# Patient Record
Sex: Female | Born: 1990 | Race: White | Hispanic: No | Marital: Married | Smoking: Never smoker
Health system: Southern US, Community
[De-identification: ages and names within clinical notes are randomized; demographics above are authoritative.]

---

## 2016-05-17 ENCOUNTER — Ambulatory Visit (HOSPITAL_COMMUNITY): Payer: BLUE CROSS/BLUE SHIELD | Admitting: Psychiatry

## 2017-04-23 DIAGNOSIS — F419 Anxiety disorder, unspecified: Secondary | ICD-10-CM | POA: Insufficient documentation

## 2017-04-25 DIAGNOSIS — R894 Abnormal immunological findings in specimens from other organs, systems and tissues: Secondary | ICD-10-CM | POA: Insufficient documentation
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
## 2018-04-29 ENCOUNTER — Ambulatory Visit (HOSPITAL_COMMUNITY): Payer: 59 | Admitting: Psychiatry

## 2018-04-29 ENCOUNTER — Encounter (HOSPITAL_COMMUNITY): Payer: Self-pay | Admitting: Psychiatry

## 2018-04-29 ENCOUNTER — Encounter (INDEPENDENT_AMBULATORY_CARE_PROVIDER_SITE_OTHER): Payer: Self-pay

## 2018-04-29 VITALS — BP 109/72 | HR 69 | Ht 66.0 in | Wt 192.0 lb

## 2018-04-29 DIAGNOSIS — F4310 Post-traumatic stress disorder, unspecified: Secondary | ICD-10-CM | POA: Insufficient documentation

## 2018-04-29 DIAGNOSIS — F331 Major depressive disorder, recurrent, moderate: Secondary | ICD-10-CM

## 2018-04-29 LAB — TSH: TSH: 1.34 mIU/L

## 2018-04-29 MED ORDER — VENLAFAXINE HCL ER 37.5 MG PO CP24: 37.5000 mg | ORAL_CAPSULE | Freq: Every day | ORAL | 0 refills | Status: DC

## 2018-04-29 NOTE — Patient Instructions (Signed)
1. Start venlafaxine 37.5 mg daily for one week, then 75 mg daily  2. Continue Abilify 20 mg daily  3. Return to clinic in one month for 30 mins 4. Check TSH
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
## 2018-05-30 ENCOUNTER — Other Ambulatory Visit: Payer: Self-pay

## 2018-05-30 ENCOUNTER — Ambulatory Visit (INDEPENDENT_AMBULATORY_CARE_PROVIDER_SITE_OTHER): Payer: 59 | Admitting: Psychiatry

## 2018-05-30 ENCOUNTER — Encounter (HOSPITAL_COMMUNITY): Payer: Self-pay | Admitting: Psychiatry

## 2018-05-30 DIAGNOSIS — F4310 Post-traumatic stress disorder, unspecified: Secondary | ICD-10-CM

## 2018-05-30 DIAGNOSIS — Z79899 Other long term (current) drug therapy: Secondary | ICD-10-CM | POA: Diagnosis not present

## 2018-05-30 DIAGNOSIS — F330 Major depressive disorder, recurrent, mild: Secondary | ICD-10-CM

## 2018-05-30 MED ORDER — VENLAFAXINE HCL ER 75 MG PO CP24: 75.0000 mg | ORAL_CAPSULE | Freq: Every day | ORAL | 0 refills | Status: DC

## 2018-05-30 NOTE — Patient Instructions (Signed)
1. Continue venlafaxine 75 mg daily 2. Hold Abilify 3. Return to clinic in one month for 30 mins, 5/1 at 9 AM for 15 mins
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
## 2018-06-27 ENCOUNTER — Other Ambulatory Visit: Payer: Self-pay

## 2018-06-27 ENCOUNTER — Encounter (HOSPITAL_COMMUNITY): Payer: 59 | Admitting: Psychiatry

## 2018-06-30 NOTE — Progress Notes (Signed)
This encounter was created in error - please disregard.
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
## 2018-07-28 ENCOUNTER — Other Ambulatory Visit: Payer: Self-pay

## 2018-07-28 ENCOUNTER — Ambulatory Visit (INDEPENDENT_AMBULATORY_CARE_PROVIDER_SITE_OTHER): Payer: 59 | Admitting: Psychiatry

## 2018-07-28 ENCOUNTER — Encounter (HOSPITAL_COMMUNITY): Payer: Self-pay | Admitting: Psychiatry

## 2018-07-28 DIAGNOSIS — F4310 Post-traumatic stress disorder, unspecified: Secondary | ICD-10-CM

## 2018-07-28 DIAGNOSIS — F3341 Major depressive disorder, recurrent, in partial remission: Secondary | ICD-10-CM

## 2018-07-28 MED ORDER — VENLAFAXINE HCL ER 37.5 MG PO CP24: ORAL_CAPSULE | ORAL | 0 refills | Status: DC

## 2018-07-28 MED ORDER — VENLAFAXINE HCL ER 75 MG PO CP24: ORAL_CAPSULE | ORAL | 0 refills | Status: DC

## 2018-08-13 ENCOUNTER — Telehealth (HOSPITAL_COMMUNITY): Payer: Self-pay | Admitting: Psychiatry

## 2018-08-13 NOTE — Telephone Encounter (Signed)
Patient called and said that since her medication Effexor was increased she has had increased suicidal thoughts and she has been unable to focus.  Patient said that this has been going on for almost one week. [phone] phone.    Please advise

## 2018-08-13 NOTE — Telephone Encounter (Signed)
Dr. Hisada's pt 
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
## 2018-08-13 NOTE — Telephone Encounter (Signed)
Called the patient. She states that she has been feeling more depressed and has had SI for the past week. She denies plan, intent. Although she goes to work regularly, she has had worsening in inattention. Her therapist is on leave and has not  been able to see her for the past two weeks. She has not noticed any other change except the medication dose change since the last appointment. Discussed the following: - Decrease venlafaxine 75 mg daily - Emergency resources which includes 911, ED, suicide crisis line [phone]) are discussed.   - Next appointment: 6/24 at 8:40  - She agrees to contact our office if any additional concerns.

## 2018-08-20 ENCOUNTER — Encounter (HOSPITAL_COMMUNITY): Payer: Self-pay | Admitting: Psychiatry

## 2018-08-20 ENCOUNTER — Ambulatory Visit (INDEPENDENT_AMBULATORY_CARE_PROVIDER_SITE_OTHER): Payer: 59 | Admitting: Psychiatry

## 2018-08-20 ENCOUNTER — Other Ambulatory Visit: Payer: Self-pay

## 2018-08-20 DIAGNOSIS — F4310 Post-traumatic stress disorder, unspecified: Secondary | ICD-10-CM

## 2018-08-20 DIAGNOSIS — F331 Major depressive disorder, recurrent, moderate: Secondary | ICD-10-CM

## 2018-08-20 MED ORDER — VENLAFAXINE HCL ER 75 MG PO CP24: 75.0000 mg | ORAL_CAPSULE | Freq: Every day | ORAL | 0 refills | Status: DC

## 2018-08-20 NOTE — Patient Instructions (Signed)
1.Continue venlafaxine 75 mg daily 2. Next appointment;  7/23 at 4 PM
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
## 2018-09-18 ENCOUNTER — Other Ambulatory Visit: Payer: Self-pay

## 2018-09-18 ENCOUNTER — Ambulatory Visit (INDEPENDENT_AMBULATORY_CARE_PROVIDER_SITE_OTHER): Payer: 59 | Admitting: Psychiatry

## 2018-09-18 ENCOUNTER — Encounter (HOSPITAL_COMMUNITY): Payer: Self-pay | Admitting: Psychiatry

## 2018-09-18 DIAGNOSIS — F4310 Post-traumatic stress disorder, unspecified: Secondary | ICD-10-CM

## 2018-09-18 DIAGNOSIS — F331 Major depressive disorder, recurrent, moderate: Secondary | ICD-10-CM | POA: Diagnosis not present

## 2018-09-18 MED ORDER — ESCITALOPRAM OXALATE 10 MG PO TABS: ORAL_TABLET | ORAL | 0 refills | Status: DC

## 2018-09-18 NOTE — Patient Instructions (Signed)
1. Start lexapro 5 mg daily for one week, then 10 mg daily  2. Decrease venlafaxine 37.5 mg daily for one week, then discontinue  3. Next appointment: 8/19 at 9:20

## 2018-09-29 ENCOUNTER — Ambulatory Visit (HOSPITAL_COMMUNITY): Payer: 59 | Admitting: Psychiatry
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
## 2018-10-15 ENCOUNTER — Other Ambulatory Visit: Payer: Self-pay

## 2018-10-15 ENCOUNTER — Ambulatory Visit (HOSPITAL_COMMUNITY): Payer: 59 | Admitting: Psychiatry

## 2018-11-19 ENCOUNTER — Other Ambulatory Visit (HOSPITAL_COMMUNITY): Payer: Self-pay | Admitting: Psychiatry

## 2018-11-19 ENCOUNTER — Telehealth (HOSPITAL_COMMUNITY): Payer: Self-pay | Admitting: *Deleted

## 2018-11-19 MED ORDER — ESCITALOPRAM OXALATE 10 MG PO TABS: 10.0000 mg | ORAL_TABLET | Freq: Every day | ORAL | 0 refills | Status: DC

## 2018-11-19 NOTE — Telephone Encounter (Signed)
Ordered lexapro. Please discuss attendance policy.

## 2018-11-19 NOTE — Telephone Encounter (Signed)
Patient called [facility] to Reschedule appt that she no showed on 10/15/2018. Next Appt 12/05/2018 &  She need's refills.

## 2018-11-26 NOTE — Progress Notes (Signed)
This encounter was created in error - please disregard.

## 2018-12-05 ENCOUNTER — Other Ambulatory Visit: Payer: Self-pay

## 2018-12-05 ENCOUNTER — Encounter (HOSPITAL_COMMUNITY): Payer: 59 | Admitting: Psychiatry

## 2018-12-05 ENCOUNTER — Telehealth (HOSPITAL_COMMUNITY): Payer: Self-pay | Admitting: Psychiatry

## 2018-12-05 NOTE — Telephone Encounter (Signed)
She states that she misunderstood the scheduled time. She is driving, and on her way for family emergency. She is unable to pull over her car. Will reschedule the appointment. 10/19 at 10:20.
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
## 2018-12-15 ENCOUNTER — Other Ambulatory Visit: Payer: Self-pay

## 2018-12-15 ENCOUNTER — Encounter (HOSPITAL_COMMUNITY): Payer: Self-pay | Admitting: Psychiatry

## 2018-12-15 ENCOUNTER — Ambulatory Visit (INDEPENDENT_AMBULATORY_CARE_PROVIDER_SITE_OTHER): Payer: 59 | Admitting: Psychiatry

## 2018-12-15 DIAGNOSIS — F331 Major depressive disorder, recurrent, moderate: Secondary | ICD-10-CM

## 2018-12-15 DIAGNOSIS — F4310 Post-traumatic stress disorder, unspecified: Secondary | ICD-10-CM

## 2018-12-15 MED ORDER — ESCITALOPRAM OXALATE 20 MG PO TABS: 20.0000 mg | ORAL_TABLET | Freq: Every day | ORAL | 0 refills | Status: DC

## 2019-03-17 ENCOUNTER — Ambulatory Visit (HOSPITAL_COMMUNITY): Payer: 59 | Admitting: Psychiatry

## 2019-04-02 ENCOUNTER — Encounter: Payer: Self-pay | Admitting: Psychiatry

## 2019-04-02 ENCOUNTER — Other Ambulatory Visit: Payer: Self-pay

## 2019-04-02 ENCOUNTER — Ambulatory Visit (INDEPENDENT_AMBULATORY_CARE_PROVIDER_SITE_OTHER): Payer: 59 | Admitting: Psychiatry

## 2019-04-02 DIAGNOSIS — F4310 Post-traumatic stress disorder, unspecified: Secondary | ICD-10-CM

## 2019-04-02 DIAGNOSIS — F3341 Major depressive disorder, recurrent, in partial remission: Secondary | ICD-10-CM

## 2019-04-02 MED ORDER — DOXEPIN HCL 25 MG PO CAPS: ORAL_CAPSULE | ORAL | 2 refills | Status: DC

## 2019-04-02 MED ORDER — ESCITALOPRAM OXALATE 20 MG PO TABS: 20.0000 mg | ORAL_TABLET | Freq: Every day | ORAL | 0 refills | Status: DC

## 2019-04-02 NOTE — Progress Notes (Signed)
[facility] MD OP Progress Note I connected with  Brooke Allison on 04/02/19 by a video enabled telemedicine application and verified that I am speaking with the correct person using two identifiers.   I discussed the limitations of evaluation and management by telemedicine. The patient expressed understanding and agreed to proceed.    04/02/2019 11:08 AM Brooke Allison  MRN:  [id number]  Chief Complaint: " I am doing all right, but still have a hard time with sleep."  HPI: Patient reported that her mood has been stable with increased dose of Lexapro.  However she is having difficulty in falling asleep as well as staying asleep.  She does have a 1-year-old who does disrupt her sleep at times.  However she has a hard time going to sleep and as result she does not feel well rested on the next day.  She has 3 children and she works from home.  She stated that without adequate sleep is difficult for her to function during the daytime.  She reported that she used to have more frequent nightmares but that has improved significantly over the past few months.  She does not think the nightmares are disrupting her sleep. She has tried trazodone in the past but found to be too strong and could not tolerate it well.  She was open to trying doxepin to help her with difficulty in falling asleep as well as staying asleep.  Visit Diagnosis:    ICD-10-CM   1. PTSD (post-traumatic stress disorder)  F43.10   2. MDD (major depressive disorder), recurrent, in partial remission ([place])  F33.41     Past Psychiatric History: MDD, PTSD  Past Medical History: No past medical history on file. No past surgical history on file.  Family Psychiatric History: see below  Family History:  Family History  Problem Relation Age of Onset  . Bipolar disorder Mother   . Depression Mother   . Alcohol abuse Father     Social History:  Social History   Socioeconomic History  . Marital status: Married    Spouse name: Not on file  .  Number of children: Not on file  . Years of education: Not on file  . Highest education level: Not on file  Occupational History  . Not on file  Tobacco Use  . Smoking status: Not on file  Substance and Sexual Activity  . Alcohol use: Not on file  . Drug use: Not on file  . Sexual activity: Not on file  Other Topics Concern  . Not on file  Social History Narrative  . Not on file   Social Determinants of Health   Financial Resource Strain:   . Difficulty of Paying Living Expenses: Not on file  Food Insecurity:   . Worried About Charity fundraiser in the Last Year: Not on file  . Ran Out of Food in the Last Year: Not on file  Transportation Needs:   . Lack of Transportation (Medical): Not on file  . Lack of Transportation (Non-Medical): Not on file  Physical Activity:   . Days of Exercise per Week: Not on file  . Minutes of Exercise per Session: Not on file  Stress:   . Feeling of Stress : Not on file  Social Connections:   . Frequency of Communication with Friends and Family: Not on file  . Frequency of Social Gatherings with Friends and Family: Not on file  . Attends Religious Services: Not on file  . Active Member of Clubs  or Organizations: Not on file  . Attends Banker Meetings: Not on file  . Marital Status: Not on file    Allergies:  Allergies  Allergen Reactions  . Sulfa Antibiotics     Shortness of breath    Metabolic Disorder Labs: No results found for: HGBA1C, MPG No results found for: PROLACTIN No results found for: CHOL, TRIG, HDL, CHOLHDL, VLDL, LDLCALC Lab Results  Component Value Date   TSH 1.34 04/29/2018    Therapeutic Level Labs: No results found for: LITHIUM No results found for: VALPROATE No components found for:  CBMZ  Current Medications: Current Outpatient Medications  Medication Sig Dispense Refill  . escitalopram (LEXAPRO) 20 MG tablet Take 1 tablet (20 mg total) by mouth daily. 90 tablet 0   No current  facility-administered medications for this visit.    Musculoskeletal: Strength & Muscle Tone: unable to assess due to telemed visit Gait & Station: unable to assess due to telemed visit Patient leans: unable to assess due to telemed visit    Psychiatric Specialty Exam: Review of Systems  There were no vitals taken for this visit.There is no height or weight on file to calculate BMI.  General Appearance: Fairly Groomed  Eye Contact:  Good  Speech:  Clear and Coherent and Normal Rate  Volume:  Normal  Mood:  Euthymic  Affect:  Congruent  Thought Process:  Goal Directed, Linear and Descriptions of Associations: Intact  Orientation:  Full (Time, Place, and Person)  Thought Content: Logical   Suicidal Thoughts:  No  Homicidal Thoughts:  No  Memory:  Recent;   Good Remote;   Good  Judgement:  Good  Insight:  Good  Psychomotor Activity:  Normal  Concentration:  Concentration: Good and Attention Span: Good  Recall:  Good  Fund of Knowledge: Good  Language: Good  Akathisia:  Negative  Handed:  Right  AIMS (if indicated): not done  Assets:  Communication Skills Desire for Improvement Financial Resources/Insurance Housing  ADL's:  Intact  Cognition: WNL  Sleep:  Poor      Assessment and Plan: 29 year old female with history of PTSD and MDD now reporting remission of depressive symptoms however has ongoing difficulties with sleep.  She has stopped seeing a therapist for EMDR due to financial and other reasons.  She was offered doxepin to help her with sleep. Potential side effects of medication and risks vs benefits of treatment vs non-treatment were explained and discussed. All questions were answered.  1. PTSD (post-traumatic stress disorder)  - Continue escitalopram (LEXAPRO) 20 MG tablet; Take 1 tablet (20 mg total) by mouth daily.  Dispense: 90 tablet; Refill: 0  2. MDD (major depressive disorder), recurrent, in partial remission ([facility])  - escitalopram (LEXAPRO) 20 MG  tablet; Take 1 tablet (20 mg total) by mouth daily.  Dispense: 90 tablet; Refill: 0 - Start doxepin (SINEQUAN) 25 MG capsule; Take one to two capsules at night as needed for sleep  Dispense: 60 capsule; Refill: 2   Follow up in 3 months.   Zena Amos, MD 04/02/2019, 11:08 AM
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
## 2019-06-30 ENCOUNTER — Telehealth (INDEPENDENT_AMBULATORY_CARE_PROVIDER_SITE_OTHER): Payer: 59 | Admitting: Psychiatry

## 2019-06-30 ENCOUNTER — Other Ambulatory Visit: Payer: Self-pay

## 2019-06-30 ENCOUNTER — Encounter (HOSPITAL_COMMUNITY): Payer: Self-pay | Admitting: Psychiatry

## 2019-06-30 DIAGNOSIS — R41840 Attention and concentration deficit: Secondary | ICD-10-CM | POA: Diagnosis not present

## 2019-06-30 DIAGNOSIS — F4310 Post-traumatic stress disorder, unspecified: Secondary | ICD-10-CM | POA: Diagnosis not present

## 2019-06-30 DIAGNOSIS — F3342 Major depressive disorder, recurrent, in full remission: Secondary | ICD-10-CM

## 2019-06-30 MED ORDER — ESCITALOPRAM OXALATE 20 MG PO TABS: 20.0000 mg | ORAL_TABLET | Freq: Every day | ORAL | 0 refills | Status: DC

## 2019-06-30 NOTE — Patient Instructions (Signed)
1. Continue lexapro 20 mg daily  2. Referral to neuropsychological testing 3. Next appointment: 6/28 at 10:20
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
## 2019-08-24 ENCOUNTER — Encounter (HOSPITAL_COMMUNITY): Payer: Self-pay | Admitting: Psychiatry

## 2019-08-24 ENCOUNTER — Telehealth (INDEPENDENT_AMBULATORY_CARE_PROVIDER_SITE_OTHER): Payer: 59 | Admitting: Psychiatry

## 2019-08-24 DIAGNOSIS — R41840 Attention and concentration deficit: Secondary | ICD-10-CM

## 2019-08-24 DIAGNOSIS — G4700 Insomnia, unspecified: Secondary | ICD-10-CM | POA: Diagnosis not present

## 2019-08-24 DIAGNOSIS — F4310 Post-traumatic stress disorder, unspecified: Secondary | ICD-10-CM | POA: Diagnosis not present

## 2019-08-24 DIAGNOSIS — F330 Major depressive disorder, recurrent, mild: Secondary | ICD-10-CM

## 2019-08-24 MED ORDER — ESCITALOPRAM OXALATE 20 MG PO TABS: 20.0000 mg | ORAL_TABLET | Freq: Every day | ORAL | 0 refills | Status: DC

## 2019-08-24 MED ORDER — BUPROPION HCL ER (XL) 150 MG PO TB24: 150.0000 mg | ORAL_TABLET | Freq: Every day | ORAL | 1 refills | Status: DC

## 2019-08-24 NOTE — Patient Instructions (Signed)
1.Continue lexapro 20 mg daily 2. Start bupropion 150 mg daily  3. Continue doxepin 25-50 mg at night as needed for sleep 4.Next appointment: 8/16 at 10:50
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
## 2019-10-12 ENCOUNTER — Encounter (HOSPITAL_COMMUNITY): Payer: Self-pay | Admitting: Psychiatry

## 2019-10-12 ENCOUNTER — Telehealth (INDEPENDENT_AMBULATORY_CARE_PROVIDER_SITE_OTHER): Payer: 59 | Admitting: Psychiatry

## 2019-10-12 ENCOUNTER — Other Ambulatory Visit: Payer: Self-pay

## 2019-10-12 DIAGNOSIS — F330 Major depressive disorder, recurrent, mild: Secondary | ICD-10-CM | POA: Diagnosis not present

## 2019-10-12 DIAGNOSIS — F4310 Post-traumatic stress disorder, unspecified: Secondary | ICD-10-CM

## 2019-10-12 DIAGNOSIS — G4700 Insomnia, unspecified: Secondary | ICD-10-CM

## 2019-10-12 DIAGNOSIS — R41840 Attention and concentration deficit: Secondary | ICD-10-CM

## 2019-10-12 MED ORDER — BUPROPION HCL ER (XL) 300 MG PO TB24: 300.0000 mg | ORAL_TABLET | Freq: Every day | ORAL | 1 refills | Status: DC

## 2019-10-12 NOTE — Patient Instructions (Signed)
1.Continuelexapro 20 mg daily 2. Increasebupropion 300 mg daily  3. Continue doxepin 25-50 mg at night as needed for sleep 4.Next appointment:10/4 at 10:40

## 2019-10-22 ENCOUNTER — Ambulatory Visit (INDEPENDENT_AMBULATORY_CARE_PROVIDER_SITE_OTHER): Payer: 59 | Admitting: Psychology

## 2019-10-22 DIAGNOSIS — F4310 Post-traumatic stress disorder, unspecified: Secondary | ICD-10-CM

## 2019-10-22 DIAGNOSIS — F411 Generalized anxiety disorder: Secondary | ICD-10-CM

## 2019-11-27 ENCOUNTER — Ambulatory Visit: Payer: 59 | Admitting: Psychology
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
## 2019-11-30 ENCOUNTER — Telehealth (INDEPENDENT_AMBULATORY_CARE_PROVIDER_SITE_OTHER): Payer: 59 | Admitting: Psychiatry

## 2019-11-30 ENCOUNTER — Other Ambulatory Visit: Payer: Self-pay

## 2019-11-30 ENCOUNTER — Encounter (HOSPITAL_COMMUNITY): Payer: Self-pay | Admitting: Psychiatry

## 2019-11-30 DIAGNOSIS — F4310 Post-traumatic stress disorder, unspecified: Secondary | ICD-10-CM

## 2019-11-30 DIAGNOSIS — F331 Major depressive disorder, recurrent, moderate: Secondary | ICD-10-CM | POA: Diagnosis not present

## 2019-11-30 MED ORDER — BUPROPION HCL ER (XL) 150 MG PO TB24: ORAL_TABLET | ORAL | 1 refills | Status: DC

## 2019-11-30 MED ORDER — BUPROPION HCL ER (XL) 300 MG PO TB24: ORAL_TABLET | ORAL | 1 refills | Status: DC

## 2019-11-30 MED ORDER — ESCITALOPRAM OXALATE 20 MG PO TABS: 20.0000 mg | ORAL_TABLET | Freq: Every day | ORAL | 0 refills | Status: DC

## 2019-11-30 NOTE — Patient Instructions (Signed)
1.Continuelexapro 20 mg daily 2. Increase bupropion 300 mg daily  3.Continue doxepin 25-50 mg at night as needed for sleep 4.Next appointment: 11.8 at 2:30

## 2019-12-03 ENCOUNTER — Telehealth (HOSPITAL_COMMUNITY): Payer: Self-pay | Admitting: Psychiatry

## 2019-12-03 NOTE — Telephone Encounter (Signed)
Called patient to advised letter was available for pick up, patient advised no longer needs was not approved for time off and has been working all week. Letter was discarded.

## 2019-12-07 ENCOUNTER — Telehealth (HOSPITAL_COMMUNITY): Payer: Self-pay | Admitting: Psychiatry

## 2019-12-07 NOTE — Telephone Encounter (Signed)
D:  Dr. Vanetta Shawl referred pt to [facility].  A:  Placed call to pt, left vm for patient to call case manager back.  Inform Dr. Vanetta Shawl.
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
## 2020-01-04 ENCOUNTER — Encounter (HOSPITAL_COMMUNITY): Payer: Self-pay

## 2020-01-04 ENCOUNTER — Emergency Department (HOSPITAL_COMMUNITY): Payer: Managed Care, Other (non HMO)

## 2020-01-04 ENCOUNTER — Emergency Department (HOSPITAL_COMMUNITY)
Admission: EM | Admit: 2020-01-04 | Discharge: 2020-01-04 | Disposition: A | Discharge status: Home / Self Care | Payer: Managed Care, Other (non HMO) | Attending: Emergency Medicine | Admitting: Emergency Medicine

## 2020-01-04 ENCOUNTER — Telehealth (INDEPENDENT_AMBULATORY_CARE_PROVIDER_SITE_OTHER): Payer: 59 | Admitting: Psychiatry

## 2020-01-04 ENCOUNTER — Telehealth (HOSPITAL_COMMUNITY): Payer: 59 | Admitting: Psychiatry

## 2020-01-04 ENCOUNTER — Other Ambulatory Visit: Payer: Self-pay

## 2020-01-04 DIAGNOSIS — K209 Esophagitis, unspecified without bleeding: Secondary | ICD-10-CM | POA: Diagnosis not present

## 2020-01-04 DIAGNOSIS — F4310 Post-traumatic stress disorder, unspecified: Secondary | ICD-10-CM

## 2020-01-04 DIAGNOSIS — F331 Major depressive disorder, recurrent, moderate: Secondary | ICD-10-CM

## 2020-01-04 DIAGNOSIS — R1013 Epigastric pain: Secondary | ICD-10-CM | POA: Diagnosis present

## 2020-01-04 LAB — CBC WITH DIFFERENTIAL/PLATELET
Abs Immature Granulocytes: 0.01 10*3/uL (ref 0.00–0.07)
Basophils Absolute: 0 10*3/uL (ref 0.0–0.1)
Basophils Relative: 0 %
Eosinophils Absolute: 0.1 10*3/uL (ref 0.0–0.5)
Eosinophils Relative: 1 %
HCT: 39.8 % (ref 36.0–46.0)
Hemoglobin: 12.4 g/dL (ref 12.0–15.0)
Immature Granulocytes: 0 %
Lymphocytes Relative: 21 %
Lymphs Abs: 1.4 10*3/uL (ref 0.7–4.0)
MCH: 25.4 pg — ABNORMAL LOW (ref 26.0–34.0)
MCHC: 31.2 g/dL (ref 30.0–36.0)
MCV: 81.4 fL (ref 80.0–100.0)
Monocytes Absolute: 0.4 10*3/uL (ref 0.1–1.0)
Monocytes Relative: 6 %
Neutro Abs: 5 10*3/uL (ref 1.7–7.7)
Neutrophils Relative %: 72 %
Platelets: 277 10*3/uL (ref 150–400)
RBC: 4.89 MIL/uL (ref 3.87–5.11)
RDW: 14.2 % (ref 11.5–15.5)
WBC: 6.9 10*3/uL (ref 4.0–10.5)
nRBC: 0 % (ref 0.0–0.2)

## 2020-01-04 LAB — COMPREHENSIVE METABOLIC PANEL
ALT: 16 U/L (ref 0–44)
AST: 19 U/L (ref 15–41)
Albumin: 4 g/dL (ref 3.5–5.0)
Alkaline Phosphatase: 71 U/L (ref 38–126)
Anion gap: 11 (ref 5–15)
BUN: 13 mg/dL (ref 6–20)
CO2: 25 mmol/L (ref 22–32)
Calcium: 9 mg/dL (ref 8.9–10.3)
Chloride: 101 mmol/L (ref 98–111)
Creatinine, Ser: 0.8 mg/dL (ref 0.44–1.00)
GFR, Estimated: >60 mL/min (ref >60)
Glucose, Bld: 93 mg/dL (ref 70–99)
Potassium: 3.5 mmol/L (ref 3.5–5.1)
Sodium: 137 mmol/L (ref 135–145)
Total Bilirubin: 0.7 mg/dL (ref 0.3–1.2)
Total Protein: 7.3 g/dL (ref 6.5–8.1)

## 2020-01-04 MED ORDER — PANTOPRAZOLE SODIUM 40 MG PO TBEC
40.0000 mg | DELAYED_RELEASE_TABLET | Freq: Once | ORAL | Status: AC
  Administered 2020-01-04: 40 mg via ORAL
  Filled 2020-01-04: qty 1

## 2020-01-04 MED ORDER — PANTOPRAZOLE SODIUM 40 MG PO TBEC: 40.0000 mg | DELAYED_RELEASE_TABLET | Freq: Every day | ORAL | 1 refills | Status: DC

## 2020-01-04 MED ORDER — PRAZOSIN HCL 1 MG PO CAPS: 1.0000 mg | ORAL_CAPSULE | Freq: Every day | ORAL | 0 refills | Status: DC

## 2020-01-04 MED ORDER — BUPROPION HCL ER (XL) 300 MG PO TB24
ORAL_TABLET | ORAL | 0 refills | Status: DC
Start: 2020-01-04 — End: 2020-02-08

## 2020-01-04 MED ORDER — PRAZOSIN HCL 2 MG PO CAPS: ORAL_CAPSULE | ORAL | 1 refills | Status: DC

## 2020-01-04 MED ORDER — BUPROPION HCL ER (XL) 150 MG PO TB24
ORAL_TABLET | ORAL | 1 refills | Status: DC
Start: 2020-01-29 — End: 2020-04-04

## 2020-01-04 NOTE — ED Provider Notes (Signed)
[facility] EMERGENCY DEPARTMENT Provider Note   CSN: [id number] Arrival date & time: 01/04/20  1752     History Chief Complaint  Patient presents with  . Chest Pain    Brooke Allison is a 29 y.o. female.  HPI She presents for evaluation of epigastric pain that radiates through to her back, present for 1 week, intermittently, worsening today.  She went to an urgent care today was treated with a GI cocktail but had no relief so was sent here for further evaluation.  No prior similar problems.  She does not smoke or drink alcohol.  She is not currently employed.  She has 3 young children at home.  No known sick contacts.  There are no other known modifying factors.    History reviewed. No pertinent past medical history.  Patient Active Problem List   Diagnosis Date Noted  . MDD (major depressive disorder), recurrent, in partial remission (HCC) 04/02/2019  . PTSD (post-traumatic stress disorder) 04/29/2018  . Major depressive disorder, recurrent episode, moderate (HCC) 04/29/2018    History reviewed. No pertinent surgical history.   OB History   No obstetric history on file.     Family History  Problem Relation Age of Onset  . Bipolar disorder Mother   . Depression Mother   . Alcohol abuse Father     Social History   Tobacco Use  . Smoking status: Never Smoker  . Smokeless tobacco: Never Used  Substance Use Topics  . Alcohol use: Yes    Comment: every weekend  . Drug use: Never    Home Medications Prior to Admission medications   Medication Sig Start Date End Date Taking? Authorizing Provider  buPROPion (WELLBUTRIN XL) 150 MG 24 hr tablet 450 mg daily (take along with 300 mg tab) 01/29/20   [name], MD  buPROPion (WELLBUTRIN XL) 300 MG 24 hr tablet 450 mg daily. Take along with 150 mg tab 01/04/20   [name], MD  doxepin (SINEQUAN) 25 MG capsule Take one to two capsules at night as needed for sleep 04/02/19   Zena Amos, MD  escitalopram (LEXAPRO) 20  MG tablet Take 1 tablet (20 mg total) by mouth daily. 12/29/19   [name], MD  pantoprazole (PROTONIX) 40 MG tablet Take 1 tablet (40 mg total) by mouth daily. 01/04/20   Mancel Bale, MD  prazosin (MINIPRESS) 1 MG capsule Take 1 capsule (1 mg total) by mouth at bedtime. 01/04/20   [name], MD  prazosin (MINIPRESS) 2 MG capsule 2 mg at night. Start after completing 1 mg at night for 3 days 01/04/20   [name], MD    Allergies    Sulfa antibiotics  Review of Systems   Review of Systems  All other systems reviewed and are negative.   Physical Exam Updated Vital Signs BP 114/72   Pulse 66   Resp 18   Ht 5\' 7"  (1.702 m)   Wt 99.8 kg   LMP 12/21/2019   SpO2 98%   BMI 34.46 kg/m   Physical Exam Vitals and nursing note reviewed.  Constitutional:      General: She is not in acute distress.    Appearance: She is well-developed. She is not ill-appearing, toxic-appearing or diaphoretic.  HENT:     Head: Normocephalic and atraumatic.     Right Ear: External ear normal.     Left Ear: External ear normal.  Eyes:     Conjunctiva/sclera: Conjunctivae normal.     Pupils: Pupils are equal,  round, and reactive to light.  Neck:     Trachea: Phonation normal.  Cardiovascular:     Rate and Rhythm: Normal rate and regular rhythm.     Heart sounds: Normal heart sounds.  Pulmonary:     Effort: Pulmonary effort is normal.     Breath sounds: Normal breath sounds.  Abdominal:     General: There is no distension.     Palpations: Abdomen is soft.     Tenderness: There is no abdominal tenderness.  Musculoskeletal:        General: Normal range of motion.     Cervical back: Normal range of motion and neck supple.  Skin:    General: Skin is warm and dry.  Neurological:     Mental Status: She is alert and oriented to person, place, and time.     Cranial Nerves: No cranial nerve deficit.     Sensory: No sensory deficit.     Motor: No abnormal muscle tone.     Coordination:  Coordination normal.  Psychiatric:        Mood and Affect: Mood normal.        Behavior: Behavior normal.        Thought Content: Thought content normal.        Judgment: Judgment normal.     ED Results / Procedures / Treatments   Labs (all labs ordered are listed, but only abnormal results are displayed) Labs Reviewed  CBC WITH DIFFERENTIAL/PLATELET - Abnormal; Notable for the following components:      Result Value   MCH 25.4 (*)    All other components within normal limits  COMPREHENSIVE METABOLIC PANEL    EKG EKG Interpretation  Date/Time:  Monday January 04 2020 18:41:38 EST Ventricular Rate:  78 PR Interval:  164 QRS Duration: 92 QT Interval:  356 QTC Calculation: 405 R Axis:   104 Text Interpretation: Normal sinus rhythm Rightward axis Nonspecific ST abnormality Abnormal ECG Since last tracing of earlier today No significant change was found Confirmed by Mancel Bale [phone]) on 01/04/2020 6:57:34 PM   Radiology DG Chest 2 View  Result Date: 01/04/2020 CLINICAL DATA:  29 year old female with chest pain. EXAM: CHEST - 2 VIEW COMPARISON:  None. FINDINGS: The heart size and mediastinal contours are within normal limits. Both lungs are clear. The visualized skeletal structures are unremarkable. IMPRESSION: No active cardiopulmonary disease. Electronically Signed   By: Elgie Collard M.D.   On: 01/04/2020 19:07    Procedures Procedures (including critical care time)  Medications Ordered in ED Medications  pantoprazole (PROTONIX) EC tablet 40 mg (has no administration in time range)    ED Course  I have reviewed the triage vital signs and the nursing notes.  Pertinent labs & imaging results that were available during my care of the patient were reviewed by me and considered in my medical decision making (see chart for details).    MDM Rules/Calculators/A&P                           Patient Vitals for the past 24 hrs:  BP Pulse Resp SpO2 Height Weight    01/04/20 2230 114/72 66 18 98 % -- --  01/04/20 2208 116/74 76 19 98 % -- --  01/04/20 1849 -- -- -- -- 5\' 7"  (1.702 m) 99.8 kg    11:09 PM Reevaluation with update and discussion. After initial assessment and treatment, an updated evaluation reveals no change in clinical  status, findings discussed with the patient all questions were answered. Mancel Bale   Medical Decision Making:  This patient is presenting for evaluation of epigastric discomfort, which does require a range of treatment options, and is a complaint that involves a moderate risk of morbidity and mortality. The differential diagnoses include gastritis, esophagitis, pancreatitis. I decided to review old records, and in summary healthy young female, who does not smoke cigarettes or drink alcohol presenting with epigastric pain radiating through to her back.  I did not require additional historical information from anyone.    Clinical Laboratory Tests Ordered, included CBC and Metabolic panel. Review indicates normal. Radiologic Tests Ordered, included chest x-ray.  I independently Visualized: Radiographic images, which show no infiltrate or edema  Cardiac Monitor Tracing which shows normal sinus rhythm .  Critical Interventions-clinical evaluation, laboratory testing, chest x-ray, observation reassessment.  Treated with Protonix  After These Interventions, the Patient was reevaluated and was found stable for discharge.  Clinical-year-old consistent with esophagitis.  Patient is nontoxic with normal vital signs and reassuring evaluation.  No indication for further ED intervention or hospitalization  CRITICAL CARE-no Performed by: Mancel Bale  Nursing Notes Reviewed/ Care Coordinated Applicable Imaging Reviewed Interpretation of Laboratory Data incorporated into ED treatment  The patient appears reasonably screened and/or stabilized for discharge and I doubt any other medical condition or other [facility] requiring further  screening, evaluation, or treatment in the ED at this time prior to discharge.  Plan: Home Medications-continue usual medications; Home Treatments-correct advance diet and activity; return here if the recommended treatment, does not improve the symptoms; Recommended follow up-PCP follow-up as needed      Final Clinical Impression(s) / ED Diagnoses Final diagnoses:  Esophagitis    Rx / DC Orders ED Discharge Orders         Ordered    pantoprazole (PROTONIX) 40 MG tablet  Daily        01/04/20 2308           Mancel Bale, MD 01/04/20 2309

## 2020-01-04 NOTE — ED Triage Notes (Signed)
Pt reports epigastric pain off and on x 2 weeks.  Went to urgent care today and had GI cocktail and sent here for further eval.  Reports GI cocktail didn't help. REports n/v.  Denies diarrhea.

## 2020-01-04 NOTE — Discharge Instructions (Signed)
The discomfort you are having is most likely from esophagitis.  To treat this we are prescribing Protonix, which is a proton pump inhibitor.  This can decrease the amount of acid that you secrete and improve your discomfort.  It will also help to use an antacid such as liquid Mylanta or chewable Tums.  You can use these before meals and at bedtime for a week or 2, as needed.

## 2020-01-04 NOTE — ED Notes (Signed)
Report given to Tina Talbott, RN  

## 2020-01-05 ENCOUNTER — Ambulatory Visit: Payer: 59 | Admitting: Psychology

## 2020-01-05 ENCOUNTER — Encounter: Payer: Self-pay | Admitting: Psychiatry

## 2020-01-06 DIAGNOSIS — F900 Attention-deficit hyperactivity disorder, predominantly inattentive type: Secondary | ICD-10-CM

## 2020-01-06 DIAGNOSIS — F4310 Post-traumatic stress disorder, unspecified: Secondary | ICD-10-CM

## 2020-01-06 DIAGNOSIS — F331 Major depressive disorder, recurrent, moderate: Secondary | ICD-10-CM | POA: Diagnosis not present

## 2020-01-12 ENCOUNTER — Telehealth: Payer: Self-pay

## 2020-01-12 NOTE — Telephone Encounter (Signed)
pt called left message that she thinks she needs to stop the prazosin she states she having side affect but did not state what side affect she was having.

## 2020-01-12 NOTE — Telephone Encounter (Signed)
Noted, thanks!

## 2020-01-12 NOTE — Telephone Encounter (Signed)
Dr. Hisada's pt 
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
## 2020-02-08 ENCOUNTER — Encounter: Payer: Self-pay | Admitting: Psychiatry

## 2020-02-08 ENCOUNTER — Other Ambulatory Visit: Payer: Self-pay

## 2020-02-08 ENCOUNTER — Telehealth (INDEPENDENT_AMBULATORY_CARE_PROVIDER_SITE_OTHER): Payer: 59 | Admitting: Psychiatry

## 2020-02-08 DIAGNOSIS — F331 Major depressive disorder, recurrent, moderate: Secondary | ICD-10-CM

## 2020-02-08 DIAGNOSIS — F4310 Post-traumatic stress disorder, unspecified: Secondary | ICD-10-CM | POA: Diagnosis not present

## 2020-02-08 MED ORDER — ZIPRASIDONE HCL 20 MG PO CAPS: 20.0000 mg | ORAL_CAPSULE | Freq: Every day | ORAL | 0 refills | Status: DC

## 2020-02-08 MED ORDER — BUPROPION HCL ER (XL) 300 MG PO TB24: ORAL_TABLET | ORAL | 0 refills | Status: DC
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
## 2020-03-07 ENCOUNTER — Encounter: Payer: Self-pay | Admitting: Psychiatry

## 2020-03-07 ENCOUNTER — Other Ambulatory Visit: Payer: Self-pay

## 2020-03-07 ENCOUNTER — Telehealth (INDEPENDENT_AMBULATORY_CARE_PROVIDER_SITE_OTHER): Payer: 59 | Admitting: Psychiatry

## 2020-03-07 DIAGNOSIS — F4310 Post-traumatic stress disorder, unspecified: Secondary | ICD-10-CM

## 2020-03-07 DIAGNOSIS — F331 Major depressive disorder, recurrent, moderate: Secondary | ICD-10-CM | POA: Diagnosis not present

## 2020-03-07 NOTE — Patient Instructions (Signed)
1.Continuelexapro 20 mg daily 2.Continuebupropion450 mg daily 3. Start Geodon 20 mg daily  4. Continue doxepin 25-50 mg at night as needed for sleep 5.Next appointment: 2/7 at 2:50

## 2020-03-21 ENCOUNTER — Other Ambulatory Visit: Payer: Self-pay | Admitting: Psychiatry

## 2020-03-21 ENCOUNTER — Telehealth: Payer: Self-pay

## 2020-03-21 MED ORDER — BUPROPION HCL ER (XL) 300 MG PO TB24
ORAL_TABLET | ORAL | 0 refills | Status: DC
Start: 2020-03-21 — End: 2020-04-04

## 2020-03-21 NOTE — Telephone Encounter (Signed)
received a fax requesting a refil on the bupropion xl 300mg   pt last seen on 03-08-19 next appt 2-*7-22    buPROPion (WELLBUTRIN XL) 300 MG 24 hr tablet Medication Date: 02/08/2020 Department: [facility] Ordering/Authorizing: [facility], MD    Order Providers  Prescribing Provider Encounter Provider  [name], MD [name], MD   Outpatient Medication Detail   Disp Refills Start End   buPROPion (WELLBUTRIN XL) 300 MG 24 hr tablet 30 tablet 0 02/08/2020    Sig: 450 mg daily. Take along with 150 mg tab   Sent to pharmacy as: buPROPion (WELLBUTRIN XL) 300 MG 24 hr tablet   E-Prescribing Status: Receipt confirmed by pharmacy (02/08/2020 3:02 PM EST)    Pharmacy  [facility] - [place], [place] - [address]

## 2020-03-21 NOTE — Telephone Encounter (Signed)
Ordered
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
## 2020-04-04 ENCOUNTER — Encounter: Payer: Self-pay | Admitting: Psychiatry

## 2020-04-04 ENCOUNTER — Telehealth (INDEPENDENT_AMBULATORY_CARE_PROVIDER_SITE_OTHER): Payer: 59 | Admitting: Psychiatry

## 2020-04-04 ENCOUNTER — Other Ambulatory Visit: Payer: Self-pay

## 2020-04-04 DIAGNOSIS — F4310 Post-traumatic stress disorder, unspecified: Secondary | ICD-10-CM | POA: Diagnosis not present

## 2020-04-04 DIAGNOSIS — F331 Major depressive disorder, recurrent, moderate: Secondary | ICD-10-CM | POA: Diagnosis not present

## 2020-04-04 MED ORDER — ESCITALOPRAM OXALATE 20 MG PO TABS: 20.0000 mg | ORAL_TABLET | Freq: Every day | ORAL | 0 refills | Status: DC

## 2020-04-04 MED ORDER — BUPROPION HCL ER (XL) 150 MG PO TB24: ORAL_TABLET | ORAL | 0 refills | Status: DC

## 2020-04-04 MED ORDER — BUPROPION HCL ER (XL) 300 MG PO TB24
ORAL_TABLET | ORAL | 0 refills | Status: DC
Start: 2020-04-04 — End: 2020-05-16

## 2020-04-04 MED ORDER — ZIPRASIDONE HCL 20 MG PO CAPS
20.0000 mg | ORAL_CAPSULE | Freq: Two times a day (BID) | ORAL | 1 refills | Status: DC
Start: 2020-04-04 — End: 2020-05-16
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
## 2020-05-11 ENCOUNTER — Telehealth: Payer: Self-pay

## 2020-05-11 NOTE — Telephone Encounter (Signed)
Which medication she needs refills?

## 2020-05-12 NOTE — Telephone Encounter (Signed)
All her medications.

## 2020-05-16 ENCOUNTER — Other Ambulatory Visit: Payer: Self-pay

## 2020-05-16 ENCOUNTER — Telehealth (INDEPENDENT_AMBULATORY_CARE_PROVIDER_SITE_OTHER): Payer: 59 | Admitting: Psychiatry

## 2020-05-16 ENCOUNTER — Encounter: Payer: Self-pay | Admitting: Psychiatry

## 2020-05-16 DIAGNOSIS — F331 Major depressive disorder, recurrent, moderate: Secondary | ICD-10-CM

## 2020-05-16 DIAGNOSIS — F4310 Post-traumatic stress disorder, unspecified: Secondary | ICD-10-CM | POA: Diagnosis not present

## 2020-05-16 MED ORDER — BUPROPION HCL ER (XL) 150 MG PO TB24: ORAL_TABLET | ORAL | 0 refills | Status: DC

## 2020-05-16 MED ORDER — REXULTI 0.5 MG PO TABS: 0.5000 mg | ORAL_TABLET | Freq: Every day | ORAL | 1 refills | Status: DC

## 2020-05-16 MED ORDER — BUPROPION HCL ER (XL) 300 MG PO TB24
ORAL_TABLET | ORAL | 0 refills | Status: DC
Start: 2020-05-16 — End: 2020-08-12

## 2020-05-16 MED ORDER — ESCITALOPRAM OXALATE 20 MG PO TABS: 20.0000 mg | ORAL_TABLET | Freq: Every day | ORAL | 0 refills | Status: DC

## 2020-05-16 NOTE — Patient Instructions (Signed)
1.Continuelexapro 20 mg daily- monitor weight gain 2.Continuebupropion450 mg daily 3. Start rexulti 0.5 mg daily  4. Discontinue geodon  6. Continue doxepin 25-50 mg at night as needed for sleep- she declined refill 7.Next appointment: 4/27 at 11 AM
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
## 2020-06-22 ENCOUNTER — Telehealth: Payer: Self-pay | Admitting: Psychiatry

## 2020-06-22 ENCOUNTER — Other Ambulatory Visit: Payer: Self-pay

## 2020-06-22 ENCOUNTER — Telehealth: Payer: 59 | Admitting: Psychiatry

## 2020-06-22 NOTE — Telephone Encounter (Signed)
Sent link for video visit through Epic. Patient did not sign in. Called the patient for appointment scheduled today. The patient did not answer the phone. Left voice message to contact the office ([phone]).   ?

## 2020-07-01 ENCOUNTER — Other Ambulatory Visit: Payer: Self-pay

## 2020-07-01 ENCOUNTER — Encounter: Payer: Self-pay | Admitting: Psychiatry

## 2020-07-01 ENCOUNTER — Telehealth (INDEPENDENT_AMBULATORY_CARE_PROVIDER_SITE_OTHER): Payer: 59 | Admitting: Psychiatry

## 2020-07-01 DIAGNOSIS — F331 Major depressive disorder, recurrent, moderate: Secondary | ICD-10-CM

## 2020-07-01 DIAGNOSIS — F4310 Post-traumatic stress disorder, unspecified: Secondary | ICD-10-CM | POA: Diagnosis not present

## 2020-07-01 MED ORDER — BREXPIPRAZOLE 1 MG PO TABS: 1.0000 mg | ORAL_TABLET | Freq: Every day | ORAL | 1 refills | Status: DC
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
## 2020-07-01 NOTE — Patient Instructions (Signed)
1.Continuelexapro 20 mg daily 2.Continuebupropion450 mg daily 3. Incrase rexulti 1 mg daily  4. Discontinue geodon  6. Continue doxepin 25-50 mg at night as needed for sleep 7.Next appointment: 6/17 at 9:30

## 2020-07-15 ENCOUNTER — Other Ambulatory Visit: Payer: Self-pay

## 2020-07-15 ENCOUNTER — Encounter (HOSPITAL_COMMUNITY): Payer: Self-pay | Admitting: *Deleted

## 2020-07-15 ENCOUNTER — Emergency Department (HOSPITAL_COMMUNITY)
Admission: EM | Admit: 2020-07-15 | Discharge: 2020-07-15 | Disposition: A | Discharge status: Home / Self Care | Payer: Managed Care, Other (non HMO) | Attending: Emergency Medicine | Admitting: Emergency Medicine

## 2020-07-15 DIAGNOSIS — R519 Headache, unspecified: Secondary | ICD-10-CM | POA: Insufficient documentation

## 2020-07-15 DIAGNOSIS — Z79899 Other long term (current) drug therapy: Secondary | ICD-10-CM | POA: Diagnosis not present

## 2020-07-15 DIAGNOSIS — R5383 Other fatigue: Secondary | ICD-10-CM | POA: Insufficient documentation

## 2020-07-15 DIAGNOSIS — R1310 Dysphagia, unspecified: Secondary | ICD-10-CM | POA: Insufficient documentation

## 2020-07-15 LAB — CBC
HCT: 39.7 % (ref 36.0–46.0)
Hemoglobin: 12.5 g/dL (ref 12.0–15.0)
MCH: 25.7 pg — ABNORMAL LOW (ref 26.0–34.0)
MCHC: 31.5 g/dL (ref 30.0–36.0)
MCV: 81.5 fL (ref 80.0–100.0)
Platelets: 300 10*3/uL (ref 150–400)
RBC: 4.87 MIL/uL (ref 3.87–5.11)
RDW: 14.4 % (ref 11.5–15.5)
WBC: 8.6 10*3/uL (ref 4.0–10.5)
nRBC: 0 % (ref 0.0–0.2)

## 2020-07-15 LAB — BASIC METABOLIC PANEL
Anion gap: 7 (ref 5–15)
BUN: 11 mg/dL (ref 6–20)
CO2: 27 mmol/L (ref 22–32)
Calcium: 9.1 mg/dL (ref 8.9–10.3)
Chloride: 106 mmol/L (ref 98–111)
Creatinine, Ser: 0.85 mg/dL (ref 0.44–1.00)
GFR, Estimated: >60 mL/min (ref >60)
Glucose, Bld: 90 mg/dL (ref 70–99)
Potassium: 3.8 mmol/L (ref 3.5–5.1)
Sodium: 140 mmol/L (ref 135–145)

## 2020-07-15 MED ORDER — PROMETHAZINE HCL 25 MG PO TABS: 25.0000 mg | ORAL_TABLET | Freq: Four times a day (QID) | ORAL | 0 refills | Status: DC | PRN

## 2020-07-15 MED ORDER — DIPHENHYDRAMINE HCL 25 MG PO CAPS
25.0000 mg | ORAL_CAPSULE | Freq: Once | ORAL | Status: AC
  Administered 2020-07-15: 25 mg via ORAL
  Filled 2020-07-15: qty 1

## 2020-07-15 MED ORDER — PROMETHAZINE HCL 25 MG/ML IJ SOLN
INTRAMUSCULAR | Status: AC
  Filled 2020-07-15: qty 1

## 2020-07-15 MED ORDER — PANTOPRAZOLE SODIUM 40 MG IV SOLR
40.0000 mg | Freq: Once | INTRAVENOUS | Status: AC
  Administered 2020-07-15: 40 mg via INTRAVENOUS
  Filled 2020-07-15: qty 40

## 2020-07-15 MED ORDER — DEXAMETHASONE SODIUM PHOSPHATE 4 MG/ML IJ SOLN
4.0000 mg | Freq: Once | INTRAMUSCULAR | Status: AC
  Administered 2020-07-15: 4 mg via INTRAVENOUS
  Filled 2020-07-15: qty 1

## 2020-07-15 MED ORDER — SODIUM CHLORIDE 0.9 % IV BOLUS
500.0000 mL | Freq: Once | INTRAVENOUS | Status: AC
  Administered 2020-07-15: 500 mL via INTRAVENOUS

## 2020-07-15 MED ORDER — SODIUM CHLORIDE 0.9 % IV SOLN
12.5000 mg | INTRAVENOUS | Status: AC
  Administered 2020-07-15: 12.5 mg via INTRAVENOUS
  Filled 2020-07-15: qty 0.5

## 2020-07-15 NOTE — Discharge Instructions (Signed)
I given you the information for a private practice neurologist in [place] as well as a neurology group in [place].  Please use the Phenergan as prescribed you for nausea.  It also is the medication I gave you today for headache.

## 2020-07-15 NOTE — ED Provider Notes (Signed)
[facility] EMERGENCY DEPARTMENT Provider Note   CSN: [id number] Arrival date & time: 07/15/20  1803     History Chief Complaint  Patient presents with  . Dysphagia    Brooke Allison is a 30 y.o. female.  HPI Patient is a 30 year old female with past medical history significant for PTSD, major depressive disorder   Patient presented today with complaints of difficulty swallowing she states that she feels sometimes that she has gagging when she is swallowing and has some discomfort with swallowing as well.  She states that over the past 6 months she has had episodic issues with this as well as a constant headache that she describes as left-sided aching and seems to be occasionally worse when she is laying down.  She states that she occasionally has some blurred vision and was referred to a neurologist by her ophthalmologist.  She has not yet had an appointment with her neurologist.  She states she has a scheduled appointment in July.  She states that she felt somewhat nauseous today which is what prompted her to come to the ER.  She states she has had no changes of her menstrual cycle.  Denies any weakness or numbness in any extremity denies any slurred speech or confusion.  She does state that she has had somewhat decreased memory over the past few weeks.  She was seen at [facility] 3 days ago and had a normal CT scan that showed some mucosal thickening of the sinuses which did not correlate with any of her symptoms at that time.  She denies any vomiting.  She states that her headache seems to be more left-sided than right.  Is not fully circumferential.      History reviewed. No pertinent past medical history.  Patient Active Problem List   Diagnosis Date Noted  . PTSD (post-traumatic stress disorder) 04/29/2018  . Major depressive disorder, recurrent episode, moderate ([place]) 04/29/2018    History reviewed. No pertinent surgical history.   OB History   No obstetric  history on file.     Family History  Problem Relation Age of Onset  . Bipolar disorder Mother   . Depression Mother   . Alcohol abuse Father     Social History   Tobacco Use  . Smoking status: Never Smoker  . Smokeless tobacco: Never Used  Substance Use Topics  . Alcohol use: Yes    Comment: every weekend  . Drug use: Never    Home Medications Prior to Admission medications   Medication Sig Start Date End Date Taking? Authorizing Provider  brexpiprazole (REXULTI) 1 MG TABS tablet Take 1 tablet (1 mg total) by mouth daily. 07/01/20 08/30/20 Yes Norman Clay, MD  buPROPion (WELLBUTRIN XL) 150 MG 24 hr tablet 450 mg daily (take along with 300 mg tab) 05/16/20  Yes Hisada, Reina, MD  buPROPion (WELLBUTRIN XL) 300 MG 24 hr tablet 450 mg daily. Take along with 150 mg tab 05/16/20  Yes Hisada, Reina, MD  EPINEPHrine 0.3 mg/0.3 mL IJ SOAJ injection Inject 0.3 mg into the muscle as needed. 01/12/20  Yes [provider]  escitalopram (LEXAPRO) 20 MG tablet Take 1 tablet (20 mg total) by mouth daily. 05/16/20  Yes Norman Clay, MD  promethazine (PHENERGAN) 25 MG tablet Take 1 tablet (25 mg total) by mouth every 6 (six) hours as needed for nausea or vomiting. 07/15/20  Yes Angelice Piech S, PA  doxepin (SINEQUAN) 25 MG capsule Take one to two capsules at night as needed for  sleep Patient not taking: No sig reported 04/02/19   Nevada Crane, MD  pantoprazole (PROTONIX) 40 MG tablet Take 1 tablet (40 mg total) by mouth daily. Patient not taking: No sig reported 01/04/20   Daleen Bo, MD    Allergies    Prazosin and Sulfa antibiotics  Review of Systems   Review of Systems  Constitutional: Positive for fatigue. Negative for chills and fever.  HENT: Negative for congestion.   Eyes: Negative for pain.  Respiratory: Negative for cough and shortness of breath.   Cardiovascular: Negative for chest pain and leg swelling.  Gastrointestinal: Positive for nausea. Negative for abdominal  pain, diarrhea and vomiting.  Genitourinary: Negative for dysuria.       Difficulty swallowing/odynophagia  Musculoskeletal: Negative for myalgias.  Skin: Negative for rash.  Neurological: Positive for headaches. Negative for dizziness.    Physical Exam Updated Vital Signs BP 118/65 (BP Location: Left Arm)   Pulse 70   Temp 98.7 F (37.1 C) (Oral)   Resp 17   SpO2 100%   Physical Exam Vitals and nursing note reviewed.  Constitutional:      General: She is not in acute distress.    Comments: Pleasant well-appearing 30 year old.  In no acute distress.  Sitting comfortably in bed.  Able answer questions appropriately follow commands. No increased work of breathing. Speaking in full sentences.   HENT:     Head: Normocephalic and atraumatic.     Nose: Nose normal.     Mouth/Throat:     Mouth: Mucous membranes are moist.     Comments: Tolerating secretions.  Full range of motion of tongue.  Normal phonation. Eyes:     General: No scleral icterus. Cardiovascular:     Rate and Rhythm: Normal rate and regular rhythm.     Pulses: Normal pulses.     Heart sounds: Normal heart sounds.  Pulmonary:     Effort: Pulmonary effort is normal. No respiratory distress.     Breath sounds: Normal breath sounds. No wheezing.     Comments: Lungs are clear to auscultation all fields. Abdominal:     Palpations: Abdomen is soft.     Tenderness: There is no abdominal tenderness. There is no guarding or rebound.  Musculoskeletal:     Cervical back: Normal range of motion.     Right lower leg: No edema.     Left lower leg: No edema.  Skin:    General: Skin is warm and dry.     Capillary Refill: Capillary refill takes less than 2 seconds.  Neurological:     Mental Status: She is alert. Mental status is at baseline.     Comments: Alert and oriented to self, place, time and event.   Speech is fluent, clear without dysarthria or dysphasia.   Strength 5/5 in upper/lower extremities  Sensation  intact in upper/lower extremities   Normal gait.  Normal finger-to-nose and feet tapping.  CN I not tested  CN II grossly intact visual fields bilaterally. Did not visualize posterior eye.   CN III, IV, VI PERRLA and EOMs intact bilaterally  CN V Intact sensation to sharp and light touch to the face  CN VII facial movements symmetric  CN VIII not tested  CN IX, X no uvula deviation, symmetric rise of soft palate  CN XI 5/5 SCM and trapezius strength bilaterally  CN XII Midline tongue protrusion, symmetric L/R movements   Psychiatric:        Mood and Affect: Mood normal.  Comments: Somewhat strange behavior seems to have significant dependence on her husband to provide history although she is able to provide history clearly and specifically when pressed.  She seems somewhat unwilling to give history at times.  Difficult to assess whether this is reticence or withholding.     ED Results / Procedures / Treatments   Labs (all labs ordered are listed, but only abnormal results are displayed) Labs Reviewed  CBC - Abnormal; Notable for the following components:      Result Value   MCH 25.7 (*)    All other components within normal limits  BASIC METABOLIC PANEL    EKG None  Radiology No results found.  Procedures Procedures   Medications Ordered in ED Medications  sodium chloride 0.9 % bolus 500 mL (0 mLs Intravenous Stopped 07/15/20 2100)  promethazine (PHENERGAN) 12.5 mg in sodium chloride 0.9 % 50 mL IVPB (0 mg Intravenous Stopped 07/15/20 2116)  diphenhydrAMINE (BENADRYL) capsule 25 mg (25 mg Oral Given 07/15/20 2032)  dexamethasone (DECADRON) injection 4 mg (4 mg Intravenous Given 07/15/20 2032)  pantoprazole (PROTONIX) injection 40 mg (40 mg Intravenous Given 07/15/20 2032)    ED Course  I have reviewed the triage vital signs and the nursing notes.  Pertinent labs & imaging results that were available during my care of the patient were reviewed by me and considered in  my medical decision making (see chart for details).  Patient with a bizarre myriad of symptoms.  The symptoms have been ongoing for 6 months or has been no significant change today.  She claims she is having difficulty swallowing although she has been seen before with issues with esophagitis it seems that this is similar but somehow perhaps slightly worse.  She is managing her secretions well.  Has been taking her medications.  Has normal neurologic exam and although she states that she is having difficulty swallowing she has no evidence of this and is managing her secretions and was tolerating p.o. without difficulty here in the ER.  Offered CT imaging of head which was accepted however on my review of EMR it appears that she had a CT scan of her head done 3 days ago at [facility].  I discussed this with her and she states that she felt she was not taken seriously during the ER visit.  I discussed with her the importance of being transparent with these issues because confusion over what tested already been done again because redundant test to be ordered.  She was understanding of this.  Clinical Course as of 07/16/20 2343  Fri Jul 15, 2020  2115  I personally reviewed all laboratory work and imaging.  Metabolic panel without any acute abnormality specifically kidney function within normal limits and no significant electrolyte abnormalities. CBC without leukocytosis or significant anemia.  [WF]    Clinical Course User Index [WF] Tedd Sias, [place]   MDM Rules/Calculators/A&P                          Patient ultimately is looking to have follow-up with neurology she has not been scheduled for neurology appointment for a month and a half in the future.  I encouraged her to keep reaching out to different neurology offices and be open to scheduling an appointment if there is an opening in the clinic scheduled in his office.  Provided her with information for [facility] neurology and placed ambulatory  referral through [facility] neurology in hopes that  this would expedient the process.  I have relatively low suspicion for MS or pseudotumor cerebri  She is given return precautions and is understanding of plan.  Final Clinical Impression(s) / ED Diagnoses Final diagnoses:  Odynophagia  Nonintractable headache, unspecified chronicity pattern, unspecified headache type    Rx / DC Orders ED Discharge Orders         Ordered    promethazine (PHENERGAN) 25 MG tablet  Every 6 hours PRN        07/15/20 2143    Ambulatory referral to Neurology       Comments: An appointment is requested in approximately: 2 weeks   07/15/20 2145           Tedd Sias, [place] 07/16/20 2344    Milton Ferguson, MD 07/17/20 1102

## 2020-07-15 NOTE — ED Triage Notes (Signed)
C/o difficulty swallowing and chocking feeling for over a week, has multiple problems
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
## 2020-08-12 ENCOUNTER — Encounter: Payer: Self-pay | Admitting: Psychiatry

## 2020-08-12 ENCOUNTER — Other Ambulatory Visit: Payer: Self-pay

## 2020-08-12 ENCOUNTER — Telehealth (INDEPENDENT_AMBULATORY_CARE_PROVIDER_SITE_OTHER): Payer: 59 | Admitting: Psychiatry

## 2020-08-12 DIAGNOSIS — F4310 Post-traumatic stress disorder, unspecified: Secondary | ICD-10-CM

## 2020-08-12 DIAGNOSIS — F330 Major depressive disorder, recurrent, mild: Secondary | ICD-10-CM

## 2020-08-12 DIAGNOSIS — G4700 Insomnia, unspecified: Secondary | ICD-10-CM

## 2020-08-12 MED ORDER — BUPROPION HCL ER (XL) 300 MG PO TB24: ORAL_TABLET | ORAL | 1 refills | Status: DC

## 2020-08-12 MED ORDER — ESCITALOPRAM OXALATE 20 MG PO TABS: 20.0000 mg | ORAL_TABLET | Freq: Every day | ORAL | 0 refills | Status: DC

## 2020-08-12 MED ORDER — BUPROPION HCL ER (XL) 150 MG PO TB24: ORAL_TABLET | ORAL | 1 refills | Status: DC

## 2020-08-12 NOTE — Patient Instructions (Addendum)
1. Continue lexapro 20 mg daily  2. Continue bupropion 450 mg daily  3. Continue doxepin 25-50 mg at night as needed for sleep 4. Hold rexulti 5. Next appointment:  8/11 at 9 AM

## 2020-09-27 ENCOUNTER — Telehealth: Payer: Self-pay | Admitting: Diagnostic Neuroimaging

## 2020-09-27 NOTE — Telephone Encounter (Signed)
LVM for patient to call us back to r/s. Office will be closed on Friday. She can see Dr. Epimenio Foot in any of the held spots on Thursday 8/4. If that doesn't work for her, new patient referrals can find her a new date.
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
## 2020-09-30 ENCOUNTER — Ambulatory Visit: Payer: Managed Care, Other (non HMO) | Admitting: Diagnostic Neuroimaging

## 2020-10-06 ENCOUNTER — Other Ambulatory Visit: Payer: Self-pay

## 2020-10-06 ENCOUNTER — Telehealth: Payer: Self-pay | Admitting: Psychiatry

## 2020-10-06 ENCOUNTER — Telehealth: Payer: 59 | Admitting: Psychiatry

## 2020-10-06 NOTE — Telephone Encounter (Signed)
Sent link for video visit through Epic. Patient did not sign in. Called the patient for appointment scheduled today. The patient did not answer the phone. Left voice message to contact the office ([phone]).   ?
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
## 2020-10-19 ENCOUNTER — Other Ambulatory Visit: Payer: Self-pay

## 2020-10-19 ENCOUNTER — Encounter: Payer: Self-pay | Admitting: Psychiatry

## 2020-10-19 ENCOUNTER — Telehealth (INDEPENDENT_AMBULATORY_CARE_PROVIDER_SITE_OTHER): Payer: 59 | Admitting: Psychiatry

## 2020-10-19 DIAGNOSIS — F330 Major depressive disorder, recurrent, mild: Secondary | ICD-10-CM | POA: Diagnosis not present

## 2020-10-19 DIAGNOSIS — F4310 Post-traumatic stress disorder, unspecified: Secondary | ICD-10-CM

## 2020-10-19 DIAGNOSIS — G4700 Insomnia, unspecified: Secondary | ICD-10-CM

## 2020-10-19 MED ORDER — REXULTI 0.5 MG PO TABS: 0.5000 mg | ORAL_TABLET | Freq: Every day | ORAL | 1 refills | Status: AC

## 2020-10-19 MED ORDER — ESCITALOPRAM OXALATE 20 MG PO TABS: 20.0000 mg | ORAL_TABLET | Freq: Every day | ORAL | 0 refills | Status: DC

## 2020-10-19 NOTE — Patient Instructions (Signed)
1. Continue lexapro 20 mg daily  2. Continue bupropion 450 mg daily  3. Continue doxepin 25-50 mg at night as needed for sleep- 4. Start rexulti 0.5 mg daily  5. Next appointment:  10/21 at 9 AM, video 6. Referral for sleep evaluation

## 2020-10-20 ENCOUNTER — Ambulatory Visit: Payer: Managed Care, Other (non HMO) | Admitting: Neurology

## 2020-10-24 ENCOUNTER — Encounter: Payer: Self-pay | Admitting: Neurology
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
## 2020-12-16 ENCOUNTER — Encounter: Payer: Self-pay | Admitting: Psychiatry

## 2020-12-16 ENCOUNTER — Telehealth (INDEPENDENT_AMBULATORY_CARE_PROVIDER_SITE_OTHER): Payer: 59 | Admitting: Psychiatry

## 2020-12-16 ENCOUNTER — Other Ambulatory Visit: Payer: Self-pay

## 2020-12-16 DIAGNOSIS — G4700 Insomnia, unspecified: Secondary | ICD-10-CM | POA: Diagnosis not present

## 2020-12-16 DIAGNOSIS — F4310 Post-traumatic stress disorder, unspecified: Secondary | ICD-10-CM | POA: Diagnosis not present

## 2020-12-16 DIAGNOSIS — F3341 Major depressive disorder, recurrent, in partial remission: Secondary | ICD-10-CM

## 2020-12-16 NOTE — Patient Instructions (Signed)
1. Continue lexapro 20 mg daily  2. Continue bupropion 450 mg daily  3. Continue doxepin 25-50 mg at night as needed for sleep 4. Hold Rexulti  5. Next appointment:  12/6 at 11 AM
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
## 2021-01-31 ENCOUNTER — Telehealth (INDEPENDENT_AMBULATORY_CARE_PROVIDER_SITE_OTHER): Payer: 59 | Admitting: Psychiatry

## 2021-01-31 ENCOUNTER — Encounter: Payer: Self-pay | Admitting: Psychiatry

## 2021-01-31 ENCOUNTER — Other Ambulatory Visit: Payer: Self-pay

## 2021-01-31 DIAGNOSIS — F4310 Post-traumatic stress disorder, unspecified: Secondary | ICD-10-CM

## 2021-01-31 DIAGNOSIS — F3341 Major depressive disorder, recurrent, in partial remission: Secondary | ICD-10-CM

## 2021-01-31 MED ORDER — BUPROPION HCL ER (XL) 150 MG PO TB24: 150.0000 mg | ORAL_TABLET | Freq: Every day | ORAL | 1 refills | Status: DC

## 2021-01-31 MED ORDER — BUPROPION HCL ER (XL) 300 MG PO TB24: 300.0000 mg | ORAL_TABLET | Freq: Every day | ORAL | 1 refills | Status: DC

## 2021-01-31 MED ORDER — ESCITALOPRAM OXALATE 20 MG PO TABS: 20.0000 mg | ORAL_TABLET | Freq: Every day | ORAL | 0 refills | Status: DC

## 2021-01-31 NOTE — Patient Instructions (Signed)
Continue lexapro 20 mg daily  Continue bupropion 450 mg daily  Continue doxepin 25-50 mg at night as needed for sleep Next appointment:  3/7 at 10 AM, video
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
## 2021-05-02 ENCOUNTER — Telehealth (INDEPENDENT_AMBULATORY_CARE_PROVIDER_SITE_OTHER): Payer: 59 | Admitting: Psychiatry

## 2021-05-02 ENCOUNTER — Other Ambulatory Visit: Payer: Self-pay

## 2021-05-02 ENCOUNTER — Encounter: Payer: Self-pay | Admitting: Psychiatry

## 2021-05-02 DIAGNOSIS — F3341 Major depressive disorder, recurrent, in partial remission: Secondary | ICD-10-CM

## 2021-05-02 DIAGNOSIS — F4310 Post-traumatic stress disorder, unspecified: Secondary | ICD-10-CM

## 2021-05-02 MED ORDER — ESCITALOPRAM OXALATE 20 MG PO TABS: 20.0000 mg | ORAL_TABLET | Freq: Every day | ORAL | 0 refills | Status: DC

## 2021-05-02 NOTE — Patient Instructions (Addendum)
Continue lexapro 20 mg daily  ?Continue bupropion 450 mg daily  ?Discontinue doxepin ?Next appointment: 6/6 at 10 AM ? ?The next visit will be in person visit. Please arrive 15 mins before the scheduled time.  ? ?[facility]  ?Address: [address], [place], [place] [address]   ?
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
## 2021-08-01 ENCOUNTER — Telehealth (INDEPENDENT_AMBULATORY_CARE_PROVIDER_SITE_OTHER): Payer: Managed Care, Other (non HMO) | Admitting: Psychiatry

## 2021-08-01 ENCOUNTER — Telehealth: Payer: Self-pay | Admitting: Psychiatry

## 2021-08-01 ENCOUNTER — Encounter: Payer: Self-pay | Admitting: Psychiatry

## 2021-08-01 DIAGNOSIS — F331 Major depressive disorder, recurrent, moderate: Secondary | ICD-10-CM

## 2021-08-01 DIAGNOSIS — G4700 Insomnia, unspecified: Secondary | ICD-10-CM

## 2021-08-01 DIAGNOSIS — F4310 Post-traumatic stress disorder, unspecified: Secondary | ICD-10-CM

## 2021-08-01 MED ORDER — ESCITALOPRAM OXALATE 20 MG PO TABS: 20.0000 mg | ORAL_TABLET | Freq: Every day | ORAL | 0 refills | Status: DC

## 2021-08-01 MED ORDER — BUPROPION HCL ER (XL) 300 MG PO TB24: 300.0000 mg | ORAL_TABLET | Freq: Every day | ORAL | 1 refills | Status: DC

## 2021-08-01 MED ORDER — BUPROPION HCL ER (XL) 150 MG PO TB24: 150.0000 mg | ORAL_TABLET | Freq: Every day | ORAL | 1 refills | Status: DC

## 2021-08-01 NOTE — Patient Instructions (Signed)
Continue lexapro 20 mg daily  Continue bupropion 450 mg daily  Start doxepin 25 mg at night as needed for sleep Next appointment: 7/3 at 9:30

## 2021-08-01 NOTE — Telephone Encounter (Signed)
Insurance verification failed at check-in today. Contacted patient to inquire about new coverage and she stated she did not have coverage at present.
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
## 2021-08-28 ENCOUNTER — Ambulatory Visit: Payer: Managed Care, Other (non HMO) | Admitting: Psychiatry

## 2021-10-27 DIAGNOSIS — L02412 Cutaneous abscess of left axilla: Secondary | ICD-10-CM | POA: Insufficient documentation

## 2022-09-24 DIAGNOSIS — R8271 Bacteriuria: Secondary | ICD-10-CM | POA: Insufficient documentation

## 2022-10-28 LAB — PANORAMA PRENATAL TEST FULL PANEL:PANORAMA TEST PLUS 5 ADDITIONAL MICRODELETIONS: FETAL FRACTION: 6.7

## 2022-11-06 IMAGING — DX DG CHEST 2V
2 series · 2 of 2 positions shown · non-contrast
Comparison: None.

CLINICAL DATA: 29-year-old female with chest pain.

EXAM:
CHEST - 2 VIEW

[chest pa]
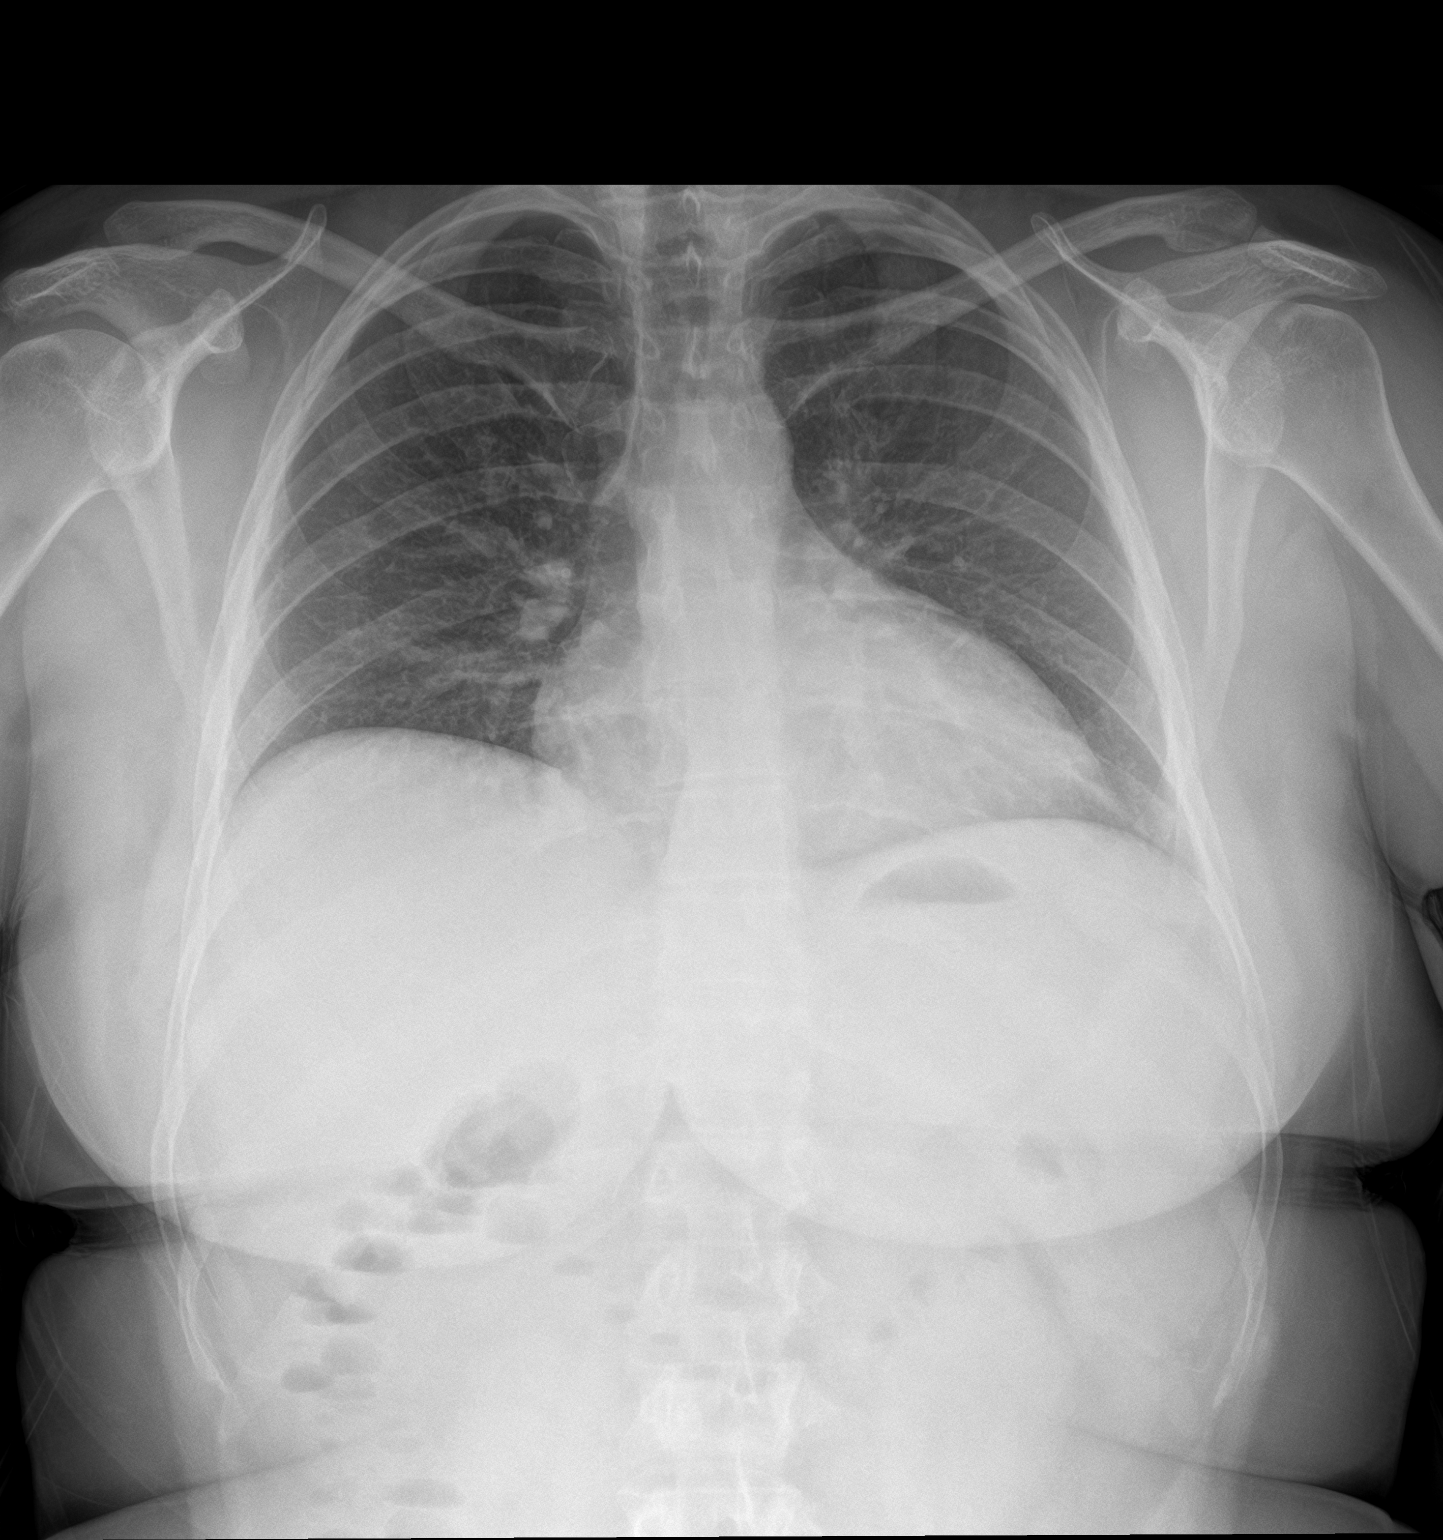

[chest lat]
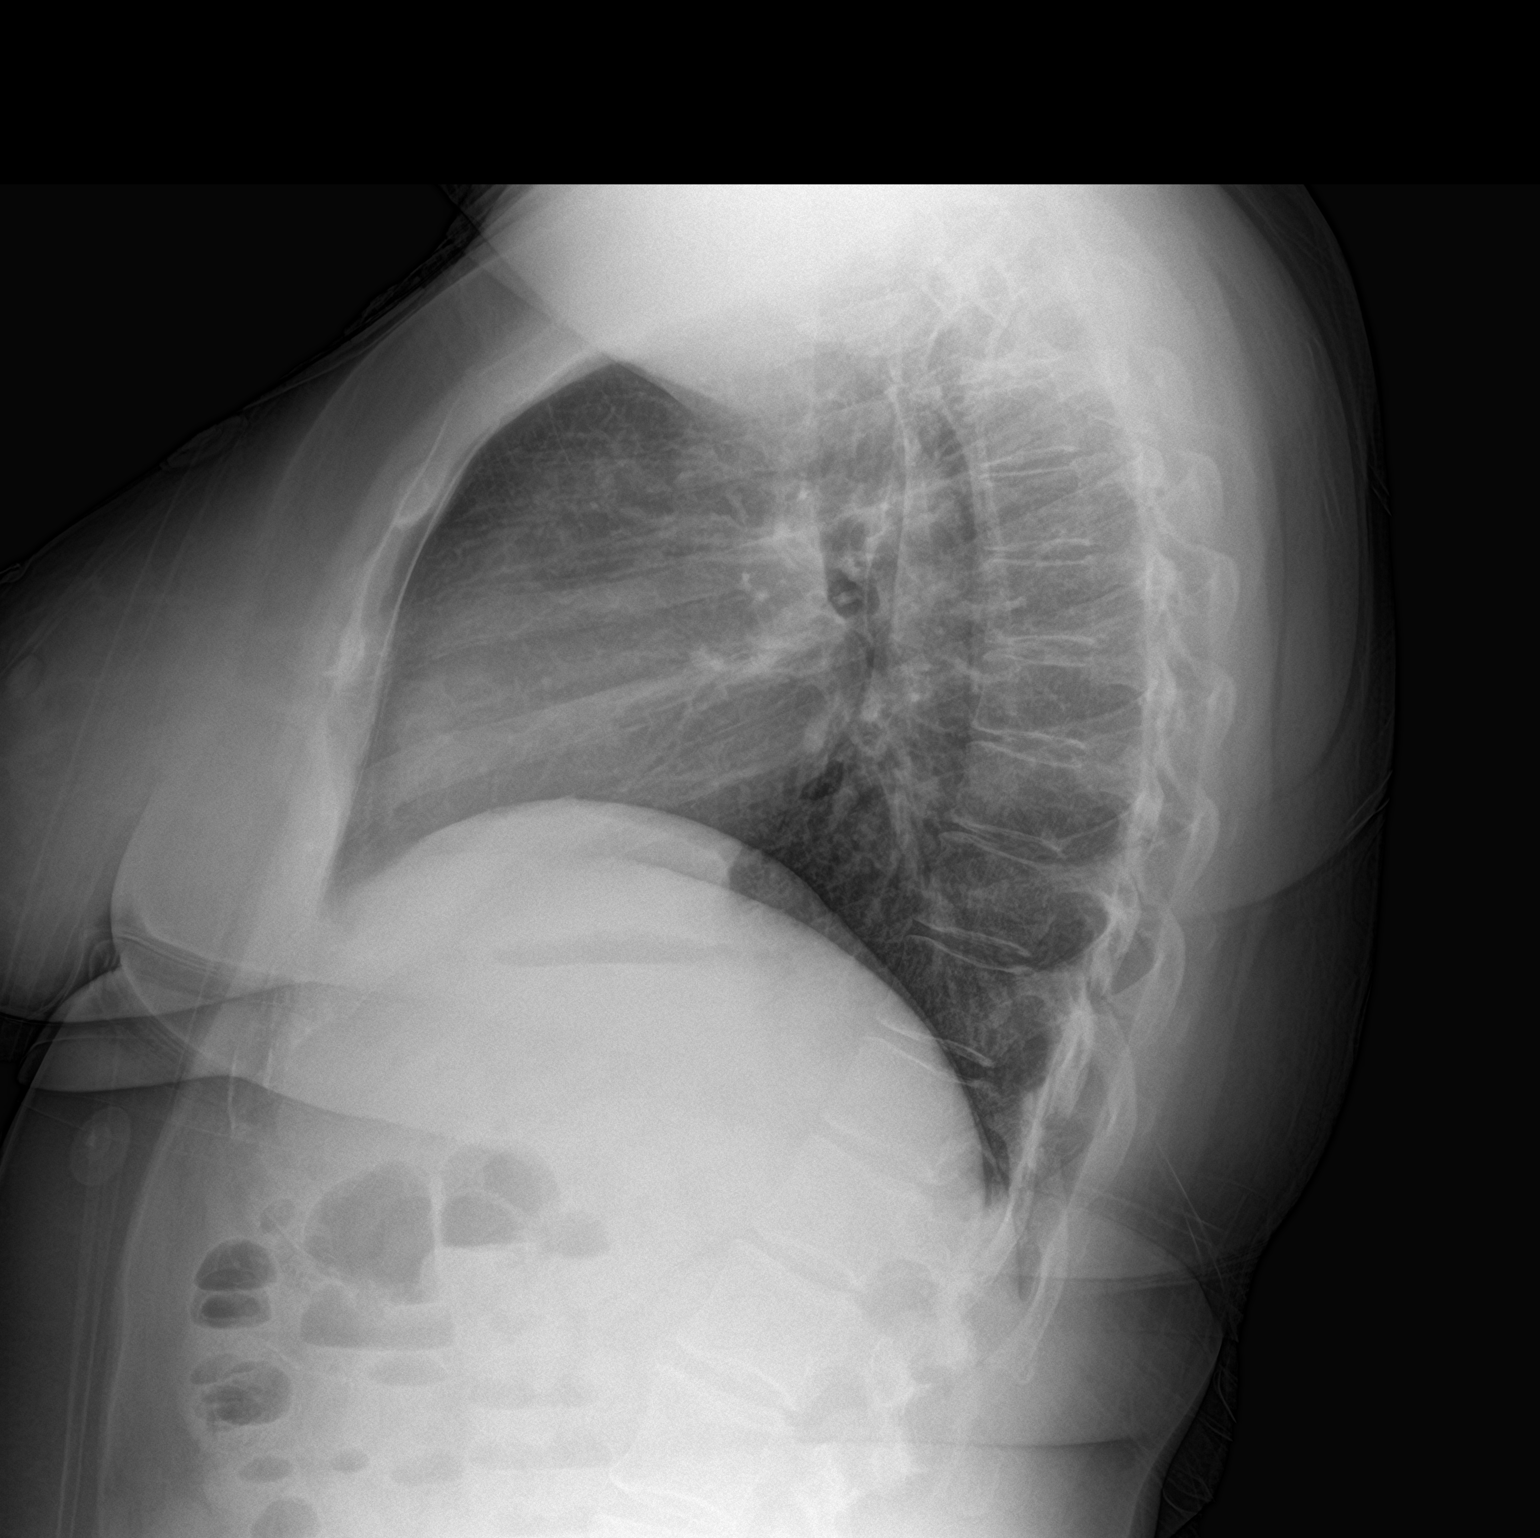

[2 of 2 positions shown; findings below may reference images not displayed]

FINDINGS: The heart size and mediastinal contours are within normal limits.
Both lungs are clear. The visualized skeletal structures are
unremarkable.
IMPRESSION: No active cardiopulmonary disease.

## 2023-08-21 ENCOUNTER — Other Ambulatory Visit: Payer: Self-pay

## 2023-08-21 ENCOUNTER — Emergency Department (HOSPITAL_COMMUNITY)
Admission: EM | Admit: 2023-08-21 | Discharge: 2023-08-21 | Disposition: A | Discharge status: Home / Self Care | Attending: Emergency Medicine | Admitting: Emergency Medicine

## 2023-08-21 ENCOUNTER — Encounter (HOSPITAL_COMMUNITY): Payer: Self-pay | Admitting: Emergency Medicine

## 2023-08-21 DIAGNOSIS — I498 Other specified cardiac arrhythmias: Secondary | ICD-10-CM | POA: Diagnosis not present

## 2023-08-21 DIAGNOSIS — R55 Syncope and collapse: Secondary | ICD-10-CM | POA: Insufficient documentation

## 2023-08-21 LAB — COMPREHENSIVE METABOLIC PANEL WITH GFR
ALT: 16 U/L (ref 0–44)
AST: 19 U/L (ref 15–41)
Albumin: 3.6 g/dL (ref 3.5–5.0)
Alkaline Phosphatase: 48 U/L (ref 38–126)
Anion gap: 7 (ref 5–15)
BUN: 10 mg/dL (ref 6–20)
CO2: 26 mmol/L (ref 22–32)
Calcium: 8.9 mg/dL (ref 8.9–10.3)
Chloride: 107 mmol/L (ref 98–111)
Creatinine, Ser: 0.69 mg/dL (ref 0.44–1.00)
GFR, Estimated: >60 mL/min (ref >60)
Glucose, Bld: 93 mg/dL (ref 70–99)
Potassium: 4.1 mmol/L (ref 3.5–5.1)
Sodium: 140 mmol/L (ref 135–145)
Total Bilirubin: 0.4 mg/dL (ref 0.0–1.2)
Total Protein: 6.5 g/dL (ref 6.5–8.1)

## 2023-08-21 LAB — URINALYSIS, ROUTINE W REFLEX MICROSCOPIC
Bilirubin Urine: NEGATIVE
Glucose, UA: NEGATIVE mg/dL
Hgb urine dipstick: NEGATIVE
Ketones, ur: NEGATIVE mg/dL
Leukocytes,Ua: NEGATIVE
Nitrite: NEGATIVE
Protein, ur: NEGATIVE mg/dL
Specific Gravity, Urine: 1.008 (ref 1.005–1.030)
pH: 8 (ref 5.0–8.0)

## 2023-08-21 LAB — CBC WITH DIFFERENTIAL/PLATELET
Abs Immature Granulocytes: 0.01 10*3/uL (ref 0.00–0.07)
Basophils Absolute: 0 10*3/uL (ref 0.0–0.1)
Basophils Relative: 0 %
Eosinophils Absolute: 0.1 10*3/uL (ref 0.0–0.5)
Eosinophils Relative: 2 %
HCT: 38.7 % (ref 36.0–46.0)
Hemoglobin: 12.4 g/dL (ref 12.0–15.0)
Immature Granulocytes: 0 %
Lymphocytes Relative: 27 %
Lymphs Abs: 1.3 10*3/uL (ref 0.7–4.0)
MCH: 27 pg (ref 26.0–34.0)
MCHC: 32 g/dL (ref 30.0–36.0)
MCV: 84.1 fL (ref 80.0–100.0)
Monocytes Absolute: 0.3 10*3/uL (ref 0.1–1.0)
Monocytes Relative: 7 %
Neutro Abs: 3.2 10*3/uL (ref 1.7–7.7)
Neutrophils Relative %: 64 %
Platelets: 214 10*3/uL (ref 150–400)
RBC: 4.6 MIL/uL (ref 3.87–5.11)
RDW: 14 % (ref 11.5–15.5)
WBC: 5 10*3/uL (ref 4.0–10.5)
nRBC: 0 % (ref 0.0–0.2)

## 2023-08-21 LAB — D-DIMER, QUANTITATIVE: D-Dimer, Quant: <0.27 ug{FEU}/mL (ref 0.00–0.50)

## 2023-08-21 LAB — PREGNANCY, URINE: Preg Test, Ur: NEGATIVE

## 2023-08-21 MED ORDER — DIPHENHYDRAMINE HCL 50 MG/ML IJ SOLN
12.5000 mg | Freq: Once | INTRAMUSCULAR | Status: AC
  Administered 2023-08-21: 12.5 mg via INTRAVENOUS
  Filled 2023-08-21: qty 1

## 2023-08-21 MED ORDER — METOCLOPRAMIDE HCL 5 MG/ML IJ SOLN
10.0000 mg | Freq: Once | INTRAMUSCULAR | Status: AC
  Administered 2023-08-21: 10 mg via INTRAVENOUS
  Filled 2023-08-21: qty 2

## 2023-08-21 MED ORDER — KETOROLAC TROMETHAMINE 15 MG/ML IJ SOLN
15.0000 mg | Freq: Once | INTRAMUSCULAR | Status: AC
  Administered 2023-08-21: 15 mg via INTRAVENOUS
  Filled 2023-08-21: qty 1

## 2023-08-21 MED ORDER — MECLIZINE HCL 25 MG PO TABS: 25.0000 mg | ORAL_TABLET | Freq: Three times a day (TID) | ORAL | 0 refills | Status: AC | PRN

## 2023-08-21 NOTE — Discharge Instructions (Addendum)
 You were evaluated in the emergency room for dizziness and syncope.  Your lab work and imaging did not show any significant abnormality.  Your monitor did show a sinus arrhythmia.  Please follow-up with your cardiologist to review this.

## 2023-08-21 NOTE — ED Provider Notes (Signed)
 [place] [facility] AT [facility] Provider Note   CSN: [id number] Arrival date & time: 08/21/23  1053     Patient presents with: Irregular Heart Beat and Chest Pain   Brooke Allison is a 33 y.o. female presents with complaints of syncope.  Patient reports that over the past 2 weeks she has had dizziness described as room spinning and near syncope.  She has had 2 episodes of syncopal events.  The first episode she had just got out of the shower.  The second she was just sitting up from bed.  She does describe associated chest pain without shortness of breath.  She has no cardiac issues.  No prior blood clots.  She was seen at a prior ED here recently.  Her workup was overall unremarkable.  She was placed on a Holter monitor.  With recurrent symptoms she came here today.  Denies any increased bleeding.  No cough vomiting or diarrhea.  She does have a history of vertigo but states the symptoms feel distinctly different.    Chest Pain  History reviewed. No pertinent past medical history.     Prior to Admission medications   Medication Sig Start Date End Date Taking? Authorizing Provider  levonorgestrel (MIRENA, 52 MG,) 20 MCG/DAY IUD  01/07/23  Yes [provider]    Allergies: Prazosin  and Sulfa antibiotics    Review of Systems  Cardiovascular:  Positive for chest pain.    Updated Vital Signs BP 113/65   Pulse (!) 50   Resp (!) 23   Ht 5' 6 (1.676 m)   Wt 81.6 kg   LMP 07/29/2023 (Approximate)   SpO2 98%   BMI 29.05 kg/m   Physical Exam Vitals and nursing note reviewed.  Constitutional:      General: She is not in acute distress.    Appearance: She is well-developed.  HENT:     Head: Normocephalic and atraumatic.   Eyes:     Conjunctiva/sclera: Conjunctivae normal.    Cardiovascular:     Rate and Rhythm: Normal rate and regular rhythm.     Heart sounds: No murmur heard. Pulmonary:     Effort: Pulmonary effort is normal. No  respiratory distress.     Breath sounds: Normal breath sounds.  Abdominal:     Palpations: Abdomen is soft.     Tenderness: There is no abdominal tenderness.   Musculoskeletal:        General: No swelling.     Cervical back: Neck supple.   Skin:    General: Skin is warm and dry.     Capillary Refill: Capillary refill takes less than 2 seconds.   Neurological:     Mental Status: She is alert.     Comments: Patient is alert and oriented. There is no abnormal phonation. Symmetric smile without facial droop. Moves all extremities spontaneously. 5/5 strength in upper and lower extremities. . No sensation deficit. There is no nystagmus. EOMI, PERRL. Coordination intact with finger to nose and normal ambulation.    Psychiatric:        Mood and Affect: Mood normal.     (all labs ordered are listed, but only abnormal results are displayed) Labs Reviewed  URINALYSIS, ROUTINE W REFLEX MICROSCOPIC - Abnormal; Notable for the following components:      Result Value   Color, Urine STRAW (*)    All other components within normal limits  CBC WITH DIFFERENTIAL/PLATELET  COMPREHENSIVE METABOLIC PANEL WITH GFR  D-DIMER, QUANTITATIVE  PREGNANCY,  URINE    EKG: None  Radiology: No results found.   Procedures   Medications Ordered in the ED  ketorolac (TORADOL) 15 MG/ML injection 15 mg (15 mg Intravenous Given 08/21/23 1448)  metoCLOPramide (REGLAN) injection 10 mg (10 mg Intravenous Given 08/21/23 1449)  diphenhydrAMINE  (BENADRYL ) injection 12.5 mg (12.5 mg Intravenous Given 08/21/23 1449)                                    Medical Decision Making Amount and/or Complexity of Data Reviewed Labs: ordered.   This patient presents to the ED with chief complaint(s) of syncope.  The complaint involves an extensive differential diagnosis and also carries with it a high risk of complications and morbidity.   Pertinent past medical history as listed in HPI  The differential diagnosis  includes  PE, vasovagal, central versus peripheral vertigo Additional history obtained: Records reviewed Care Everywhere/External Records  Assessment and management:   Hemodynamically stable, nontoxic-appearing patient presenting with complaints of near syncope x 2 weeks, with 2 syncopal events.  Does have a history of vertigo but states the symptoms feel different.  Is associated with chest pain.  Was recently evaluated at another ED.  Workup was overall unremarkable.  Today her lab work is unremarkable as well.  Dimer is again negative.  EKG without any changes.  She does have a Holter monitor on however we were unable to evaluate this here in the ED. during examination, patient did have another episode of her dizziness.  On her monitor appears that she had sinus arrhythmia without any block. Overall patient is suitable for discharge with close follow-up with her cardiologist.  Independent ECG interpretation:  Sinus rhythm, no evidence of Brugada, WPW or LVH  Independent labs interpretation:  The following labs were independently interpreted:  CBC unremarkable, UA without nitrates, leukocytes hCG negative, dimer negative  Independent visualization and interpretation of imaging: I independently visualized the following imaging with scope of interpretation limited to determining acute life threatening conditions related to emergency care: None   Consultations obtained:   Cardiology Dr. Alvan agree that the rhythm demonstrates sinus arrhythmia, does not feel that would be causing her symptoms  Disposition:   Patient will be discharged home. The patient has been appropriately medically screened and/or stabilized in the ED. I have low suspicion for any other emergent medical condition which would require further screening, evaluation or treatment in the ED or require inpatient management. At time of discharge the patient is hemodynamically stable and in no acute distress. I have discussed  work-up results and diagnosis with patient and answered all questions. Patient is agreeable with discharge plan. We discussed strict return precautions for returning to the emergency department and they verbalized understanding.     Social Determinants of Health:   none  This note was dictated with voice recognition software.  Despite best efforts at proofreading, errors may have occurred which can change the documentation meaning.       Final diagnoses:  Sinus arrhythmia  Syncope, unspecified syncope type    ED Discharge Orders     None          Donnajean Lynwood VEAR DEVONNA 08/21/23 1607    Yolande Lamar BROCKS, MD 08/22/23 1434

## 2023-08-21 NOTE — ED Triage Notes (Signed)
 Pt bib rcems w/ c/o an irregular HR. Pt stated she had a full syncopal episode 8 days ago. She did experience LOC for a reported 10 minutes. At that time she was seen at another ED and diagnosed with a pulled muscle. Milder forms of a similar episode continued to happen so she went to her pcp and was diagnosed with an irregular HR and given a holter monitor. She has continued to have periods where she will almost pass out as well as episodes where she will become tachycardic. This morning symptoms escalated to tiredness, weakness, hight tachycardia, lightheadedness and nausea. EMS reported periods of tachycardia and a sinus arrhythmia on the monitor. PT also reports periods of 7/10 pain with a hot sensation in her right calf.

## 2023-08-27 ENCOUNTER — Ambulatory Visit: Payer: Self-pay

## 2023-08-27 VITALS — BP 106/72 | HR 61 | Ht 67.0 in | Wt 179.6 lb

## 2023-08-27 DIAGNOSIS — R079 Chest pain, unspecified: Secondary | ICD-10-CM | POA: Insufficient documentation

## 2023-08-27 DIAGNOSIS — R55 Syncope and collapse: Secondary | ICD-10-CM | POA: Insufficient documentation

## 2023-08-27 NOTE — Progress Notes (Addendum)
 Cardiology Consultation:    Date:  08/27/2023   ID:  Brooke Allison, DOB 1990/06/15, MRN [id number]  PCP:  [facility]  Cardiologist:  Brooke JONELLE Kobus, MD   Referring MD: Dow Longs, PA-C   No chief complaint on file.    ASSESSMENT AND PLAN:   Brooke Allison 33 year old woman with no significant prior cardiac history.  Now with symptoms of atypical chest wall pain and symptoms of slow heart rate and near syncopal episodes.  Problem List Items Addressed This Visit     Chest pain - Primary   Atypical chest pain appears noncardiac in description. Her major symptom appears to be lightheadedness and sensation of dizziness that occurs randomly and with exertion. Will further assess with transthoracic echocardiogram and treadmill EKG with stress test for chronotropic response and blood pressure response.       Relevant Orders   EKG 12-Lead (Completed)   ECHOCARDIOGRAM COMPLETE   Exercise Tolerance Test   Near syncope   Near syncopal episodes associated with symptoms of an atypical chest wall pain. No significant arrhythmias or other abnormalities on EKG other than sinus arrhythmia.  Heart rate resting 50s and ranging from 40s to 110s on her smart watch. Zio patch results currently pending, ordered and done through her PCPs office.  Will proceed with echocardiogram tentatively at [facility] which is closer to their home in [place].  Will review thyroid  panel, heart monitor results once available and proceed with treadmill EKG stress test after the echocardiogram.  Advised to keep herself well-hydrated. Sit down or lay down immediately at the onset of symptoms.        Relevant Orders   ECHOCARDIOGRAM COMPLETE   Exercise Tolerance Test   Thyroid  Panel With TSH    Return to clinic based on test results   Addendum 09/26/2023: Zio patch results from PCPs office 11 days study 08/13/2023 through 08/23/2023 with predominantly sinus rhythm average heart rate  69/min [ranging from 39 bpm to 152 bpm. Rare ventricular and supraventricular ectopy burden less than 1%.  No sustained arrhythmias.  Total patient triggered events and diary entries 30 with heart rate ranging from 50s to 110s and with occasional isolated ventricular and supraventricular ectopic beats.  No significant pauses.  Treadmill exercise stress test results done today 09-16-2023 noted submaximal heart rate response attaining 72% MPHR, no ischemic changes after 6 minutes 25 seconds exercise as she became dizzy with ringing in her ears. Notably post peak heart rate attained heart rate recovery within 1 minute was significant.  Unclear pathology of her underlying symptoms. In the setting we will proceed with cardiac CT coronary angiogram And cardiac MRI.   History of Present Illness:    Brooke Allison is a 33 y.o. female who is being seen today for the evaluation of near syncope/syncopal episode and atypical chest pain at the request of Dow Longs, [place].  Here for the visit today accompanied by her husband.  Until few months ago she was in good health walking and running 5K without any significant limitations. Used to smoke and subsequently switched to nicotine vape and quit about a year ago.  Was consuming energy drinks frequently until she started having symptoms few weeks ago.  EKG in the clinic today shows sinus rhythm heart rate 61/min, PR interval normal 162 ms, QRS duration 90 ms, normal QTc.  No ischemic changes.  No evidence of accessory pathway or Brugada.  Over the last few weeks she has had couple episodes of near syncope  where she is symptoms of lightheadedness, no vision changes, weakness in the legs, no by her husband to be pale and out of color, EMS was called and taken to [facility].  She also reported acute chest pain under the left breast that started for20 minutes before the event. Workup in the ER was unremarkable and felt to be chest wall pain and discharged  home.  She notices her heart rates to be on the lower end on the smart watch with ranging from 40 to 110 bpm and resting heart rates in 50s.   She had a Zio patch on for 10 days and just returned it to her PCPs office.  Results currently pending.  She had a second visit on June 25 through the ER at [facility], noted to be hemodynamically stable.  Workup with EKG and blood work showed no significant abnormalities.  EKG notable only for sinus arrhythmia.  She is frustrated with her ongoing symptoms and affecting quality of life.   No past medical history on file.  No past surgical history on file.  Current Medications: Current Meds  Medication Sig   levonorgestrel (MIRENA, 52 MG,) 20 MCG/DAY IUD    meclizine  (ANTIVERT ) 25 MG tablet Take 1 tablet (25 mg total) by mouth 3 (three) times daily as needed for dizziness.     Allergies:   Prazosin  and Sulfa antibiotics   Social History   Socioeconomic History   Marital status: Married    Spouse name: Not on file   Number of children: Not on file   Years of education: Not on file   Highest education level: Not on file  Occupational History   Not on file  Tobacco Use   Smoking status: Former    Types: Cigarettes   Smokeless tobacco: Never  Vaping Use   Vaping status: Former  Substance and Sexual Activity   Alcohol use: Never    Comment: every weekend   Drug use: Never   Sexual activity: Yes  Other Topics Concern   Not on file  Social History Narrative   Not on file   Social Drivers of Health   Financial Resource Strain: Not on file  Food Insecurity: Not on file  Transportation Needs: Not on file  Physical Activity: Not on file  Stress: Not on file  Social Connections: Not on file     Family History: The patient's family history includes Alcohol abuse in her father; Bipolar disorder in her mother; Depression in her mother. ROS:   Please see the history of present illness.    All 14 point review of systems negative  except as described per history of present illness.  EKGs/Labs/Other Studies Reviewed:    The following studies were reviewed today:   EKG:  EKG Interpretation Date/Time:  Tuesday August 27 2023 16:04:39 EDT Ventricular Rate:  61 PR Interval:  162 QRS Duration:  90 QT Interval:  398 QTC Calculation: 400 R Axis:   55  Text Interpretation: Normal sinus rhythm with sinus arrhythmia Low voltage QRS When compared with ECG of 21-Aug-2023 11:25, PREVIOUS ECG IS PRESENT Confirmed by Liborio Hai reddy [id number]) on 08/27/2023 4:10:35 PM    Recent Labs: 08/21/2023: ALT 16; BUN 10; Creatinine, Ser 0.69; Hemoglobin 12.4; Platelets 214; Potassium 4.1; Sodium 140  Recent Lipid Panel No results found for: CHOL, TRIG, HDL, CHOLHDL, VLDL, LDLCALC, LDLDIRECT  Physical Exam:    VS:  BP 106/72 (BP Location: Right Arm)   Pulse 61   Ht 5' 7 (  1.702 m)   Wt 179 lb 9.6 oz (81.5 kg)   LMP 07/29/2023 (Approximate)   SpO2 98%   BMI 28.13 kg/m     Wt Readings from Last 3 Encounters:  08/27/23 179 lb 9.6 oz (81.5 kg)  08/21/23 180 lb (81.6 kg)  01/04/20 220 lb (99.8 kg)     GENERAL:  Well nourished, well developed in no acute distress NECK: No JVD; No carotid bruits CARDIAC: RRR, S1 and S2 present, no murmurs, no rubs, no gallops CHEST:  Clear to auscultation without rales, wheezing or rhonchi  Extremities: No pitting pedal edema. Pulses bilaterally symmetric with radial 2+ and dorsalis pedis 2+ NEUROLOGIC:  Alert and oriented x 3  Medication Adjustments/Labs and Tests Ordered: Current medicines are reviewed at length with the patient today.  Concerns regarding medicines are outlined above.  Orders Placed This Encounter  Procedures   Thyroid  Panel With TSH   Exercise Tolerance Test   EKG 12-Lead   ECHOCARDIOGRAM COMPLETE   No orders of the defined types were placed in this encounter.   Signed, Brooke jess Kobus, MD, MPH, [facility]. 08/27/2023 4:43 PM    [facility]

## 2023-08-27 NOTE — Assessment & Plan Note (Signed)
 Atypical chest pain appears noncardiac in description. Her major symptom appears to be lightheadedness and sensation of dizziness that occurs randomly and with exertion. Will further assess with transthoracic echocardiogram and treadmill EKG with stress test for chronotropic response and blood pressure response.

## 2023-08-27 NOTE — Assessment & Plan Note (Addendum)
 Near syncopal episodes associated with symptoms of an atypical chest wall pain. No significant arrhythmias or other abnormalities on EKG other than sinus arrhythmia.  Heart rate resting 50s and ranging from 40s to 110s on her smart watch. Zio patch results currently pending, ordered and done through her PCPs office.  Will proceed with echocardiogram tentatively at [facility] which is closer to their home in [place].  Will review thyroid panel, heart monitor results once available and proceed with treadmill EKG stress test after the echocardiogram.  Advised to keep herself well-hydrated. Sit down or lay down immediately at the onset of symptoms.

## 2023-08-27 NOTE — Patient Instructions (Addendum)
 Medication Instructions:  Your physician recommends that you continue on your current medications as directed. Please refer to the Current Medication list given to you today.  *If you need a refill on your cardiac medications before your next appointment, please call your pharmacy*   Lab Work: Your physician recommends that you return for lab work in: thyroid panel  [facility]  [address] [place], [place] [address]  General Hours Monday: 08:00 AM - 05:00 PM Closed for Lunch: 12:30 PM - 01:30 PM Tuesday: 08:00 AM - 05:00 PM Closed for Lunch: 12:30 PM - 01:30 PM Wednesday: 08:00 AM - 05:00 PM Closed for Lunch: 12:30 PM - 01:30 PM Thursday: 08:00 AM - 05:00 PM Closed for Lunch: 12:30 PM - 01:30 PM Friday: Closed Saturday: Closed Sunday: Closed  [address], [place], [place] [address]  General Hours Monday: 07:30 AM - 04:00 PM Closed for Lunch: 12:00 PM - 01:00 PM Tuesday: 07:30 AM - 04:00 PM Closed for Lunch: 12:00 PM - 01:00 PM Wednesday: 07:30 AM - 04:00 PM Closed for Lunch: 12:00 PM - 01:00 PM Thursday: 07:30 AM - 04:00 PM Closed for Lunch: 12:00 PM - 01:00 PM Friday: Closed Saturday: Closed Sunday: Closed [phone]  If you have labs (blood work) drawn today and your tests are completely normal, you will receive your results only by: [employer] (if you have MyChart) OR A paper copy in the mail If you have any lab test that is abnormal or we need to change your treatment, we will call you to review the results.  Testing/Procedures: Your physician has requested that you have an echocardiogram. Echocardiography is a painless test that uses sound waves to create images of your heart. It provides your doctor with information about the size and shape of your heart and how well your heart's chambers and valves are working. This procedure takes approximately one hour. There are no restrictions for this procedure. Please do NOT wear cologne, perfume, aftershave,  or lotions (deodorant is allowed). Please arrive 15 minutes prior to your appointment time.  Please note: We ask at that you not bring children with you during ultrasound (echo/ vascular) testing. Due to room size and safety concerns, children are not allowed in the ultrasound rooms during exams. Our front office staff cannot provide observation of children in our lobby area while testing is being conducted. An adult accompanying a patient to their appointment will only be allowed in the ultrasound room at the discretion of the ultrasound technician under special circumstances. We apologize for any inconvenience.    [place] Elspeth BIRCH. [facility] [address] [place],  [place]  [address] Main: [phone]  August 27, 2023      Brooke Allison DOB: [birth year] MRN: [id number] [address] [place] [address]   Dear [name],     Please arrive 15 minutes prior to your appointment time for registration and insurance purposes.  The test will take approximately 45 minutes to complete.  How to prepare for your Exercise Stress Test: Do bring a list of your current medications with you.  If not listed below, you may take your medications as normal. Do wear comfortable clothes (no dresses or overalls) and walking shoes, tennis shoes preferred (no heels or open toed shoes are allowed) Do Not wear cologne, perfume, aftershave or lotions (deodorant is allowed).   If these instructions are not followed, your test will have to be rescheduled.   If you  cannot keep your appointment, please provide 24 hours notification to the Stress Lab, to avoid a possible $50 charge to your account.  Follow-Up: At [facility], you and your health needs are our priority.  As part of our continuing mission to provide you with exceptional heart care, we have created designated Provider Care Teams.  These Care Teams include your primary Cardiologist (physician) and Advanced  Practice Providers (APPs -  Physician Assistants and Nurse Practitioners) who all work together to provide you with the care you need, when you need it.  We recommend signing up for the patient portal called MyChart.  Sign up information is provided on this After Visit Summary.  MyChart is used to connect with patients for Virtual Visits (Telemedicine).  Patients are able to view lab/test results, encounter notes, upcoming appointments, etc.  Non-urgent messages can be sent to your provider as well.   To learn more about what you can do with MyChart, go to [email or web address].    Your next appointment:   Follow up based on test results  The format for your next appointment:   In Person  Provider:   Alean Madireddy, MD   Other Instructions Echocardiogram An echocardiogram is a test that uses sound waves (ultrasound) to produce images of the heart. Images from an echocardiogram can provide important information about: Heart size and shape. The size and thickness and movement of your heart's walls. Heart muscle function and strength. Heart valve function or if you have stenosis. Stenosis is when the heart valves are too narrow. If blood is flowing backward through the heart valves (regurgitation). A tumor or infectious growth around the heart valves. Areas of heart muscle that are not working well because of poor blood flow or injury from a heart attack. Aneurysm detection. An aneurysm is a weak or damaged part of an artery wall. The wall bulges out from the normal force of blood pumping through the body. Tell a health care provider about: Any allergies you have. All medicines you are taking, including vitamins, herbs, eye drops, creams, and over-the-counter medicines. Any blood disorders you have. Any surgeries you have had. Any medical conditions you have. Whether you are pregnant or may be pregnant. What are the risks? Generally, this is a safe test. However, problems may  occur, including an allergic reaction to dye (contrast) that may be used during the test. What happens before the test? No specific preparation is needed. You may eat and drink normally. What happens during the test? You will take off your clothes from the waist up and put on a hospital gown. Electrodes or electrocardiogram (ECG)patches may be placed on your chest. The electrodes or patches are then connected to a device that monitors your heart rate and rhythm. You will lie down on a table for an ultrasound exam. A gel will be applied to your chest to help sound waves pass through your skin. A handheld device, called a transducer, will be pressed against your chest and moved over your heart. The transducer produces sound waves that travel to your heart and bounce back (or echo back) to the transducer. These sound waves will be captured in real-time and changed into images of your heart that can be viewed on a video monitor. The images will be recorded on a computer and reviewed by your health care provider. You may be asked to change positions or hold your breath for a short time. This makes it easier to get different views or better views of  your heart. In some cases, you may receive contrast through an IV in one of your veins. This can improve the quality of the pictures from your heart. The procedure may vary among health care providers and hospitals.   What can I expect after the test? You may return to your normal, everyday life, including diet, activities, and medicines, unless your health care provider tells you not to do that. Follow these instructions at home: It is up to you to get the results of your test. Ask your health care provider, or the department that is doing the test, when your results will be ready. Keep all follow-up visits. This is important. Summary An echocardiogram is a test that uses sound waves (ultrasound) to produce images of the heart. Images from an  echocardiogram can provide important information about the size and shape of your heart, heart muscle function, heart valve function, and other possible heart problems. You do not need to do anything to prepare before this test. You may eat and drink normally. After the echocardiogram is completed, you may return to your normal, everyday life, unless your health care provider tells you not to do that. This information is not intended to replace advice given to you by your health care provider. Make sure you discuss any questions you have with your health care provider. Document Revised: 10/06/2019 Document Reviewed: 10/06/2019 Elsevier Patient Education  2021 [employer]   Important Information About Sugar

## 2023-08-29 ENCOUNTER — Ambulatory Visit: Payer: Self-pay

## 2023-08-29 DIAGNOSIS — R072 Precordial pain: Secondary | ICD-10-CM

## 2023-08-29 DIAGNOSIS — R55 Syncope and collapse: Secondary | ICD-10-CM

## 2023-08-29 DIAGNOSIS — R079 Chest pain, unspecified: Secondary | ICD-10-CM

## 2023-08-29 LAB — THYROID PANEL WITH TSH
Free Thyroxine Index: 2.2 (ref 1.2–4.9)
T3 Uptake Ratio: 26 % (ref 24–39)
T4, Total: 8.4 ug/dL (ref 4.5–12.0)
TSH: 1.2 u[IU]/mL (ref 0.450–4.500)

## 2023-09-15 ENCOUNTER — Telehealth: Payer: Self-pay | Admitting: Internal Medicine

## 2023-09-15 NOTE — Telephone Encounter (Signed)
 Mr. Kahliyah Dick, husband of Brooke Allison, contacted the cardiology after hours line to report patient has been feeling dizzy and pain in her legs.  She has reportedly been to the emergency department multiple times for which they were not able to provide an answer for her problems.  Patient currently stable.  I reviewed her recent arrhythmia monitor for which nothing was documented.  Nothing to do at this time.  Informed husband that she should be seen by her primary care provider next week if possible for further assessment.

## 2023-09-19 ENCOUNTER — Ambulatory Visit (HOSPITAL_COMMUNITY)
Admission: RE | Admit: 2023-09-19 | Discharge: 2023-09-19 | Disposition: A | Discharge status: Home / Self Care | Source: Ambulatory Visit

## 2023-09-19 ENCOUNTER — Other Ambulatory Visit (HOSPITAL_COMMUNITY): Payer: Self-pay

## 2023-09-19 DIAGNOSIS — M79604 Pain in right leg: Secondary | ICD-10-CM

## 2023-09-20 ENCOUNTER — Telehealth (HOSPITAL_COMMUNITY): Payer: Self-pay | Admitting: *Deleted

## 2023-09-20 ENCOUNTER — Telehealth (HOSPITAL_COMMUNITY): Payer: Self-pay

## 2023-09-20 NOTE — Telephone Encounter (Signed)
 Spoke to patient as a reminder about her ETT on 09/25/23 at 1:45.

## 2023-09-20 NOTE — Telephone Encounter (Signed)
 Detailed instructions left on the patient's answering machine. S.Jassiel Flye CCT

## 2023-09-26 ENCOUNTER — Ambulatory Visit (HOSPITAL_COMMUNITY)
Admission: RE | Admit: 2023-09-26 | Discharge: 2023-09-26 | Disposition: A | Discharge status: Home / Self Care | Source: Ambulatory Visit | Attending: Cardiology | Admitting: Cardiology

## 2023-09-26 ENCOUNTER — Ambulatory Visit (HOSPITAL_BASED_OUTPATIENT_CLINIC_OR_DEPARTMENT_OTHER)
Admission: RE | Admit: 2023-09-26 | Discharge: 2023-09-26 | Disposition: A | Discharge status: Home / Self Care | Source: Ambulatory Visit

## 2023-09-26 DIAGNOSIS — R079 Chest pain, unspecified: Secondary | ICD-10-CM

## 2023-09-26 DIAGNOSIS — R55 Syncope and collapse: Secondary | ICD-10-CM

## 2023-09-26 LAB — EXERCISE TOLERANCE TEST
Angina Index: 0
Duke Treadmill Score: 6
Estimated workload: 7.6
Exercise duration (min): 6 min
Exercise duration (sec): 25 s
MPHR: 188 {beats}/min
Peak HR: 137 {beats}/min
Percent HR: 72 %
Rest HR: 72 {beats}/min
ST Depression (mm): 0 mm

## 2023-09-26 LAB — ECHOCARDIOGRAM COMPLETE
AR max vel: 2.81 cm2
AV Area VTI: 2.41 cm2
AV Area mean vel: 2.61 cm2
AV Mean grad: 4 mmHg
AV Peak grad: 7.5 mmHg
Ao pk vel: 1.37 m/s
S' Lateral: 3 cm

## 2023-09-27 NOTE — Telephone Encounter (Signed)
 MyChart message

## 2023-09-28 LAB — HEMOGLOBIN AND HEMATOCRIT, BLOOD
Hematocrit: 44.2 % (ref 34.0–46.6)
Hemoglobin: 14.2 g/dL (ref 11.1–15.9)

## 2023-10-01 ENCOUNTER — Encounter (HOSPITAL_COMMUNITY): Payer: Self-pay

## 2023-10-07 ENCOUNTER — Ambulatory Visit (HOSPITAL_COMMUNITY)

## 2023-10-11 ENCOUNTER — Encounter (HOSPITAL_COMMUNITY): Payer: Self-pay

## 2023-10-11 ENCOUNTER — Ambulatory Visit (HOSPITAL_COMMUNITY)

## 2023-10-18 ENCOUNTER — Other Ambulatory Visit: Payer: Self-pay

## 2023-10-18 DIAGNOSIS — R079 Chest pain, unspecified: Secondary | ICD-10-CM

## 2023-11-08 ENCOUNTER — Encounter (HOSPITAL_COMMUNITY): Payer: Self-pay

## 2023-11-12 ENCOUNTER — Other Ambulatory Visit: Payer: Self-pay

## 2023-11-12 ENCOUNTER — Ambulatory Visit (HOSPITAL_COMMUNITY)
Admission: RE | Admit: 2023-11-12 | Discharge: 2023-11-12 | Disposition: A | Discharge status: Home / Self Care | Source: Ambulatory Visit

## 2023-11-12 DIAGNOSIS — R079 Chest pain, unspecified: Secondary | ICD-10-CM | POA: Diagnosis present

## 2023-11-12 DIAGNOSIS — R072 Precordial pain: Secondary | ICD-10-CM

## 2023-11-12 DIAGNOSIS — R55 Syncope and collapse: Secondary | ICD-10-CM | POA: Diagnosis present

## 2023-11-12 MED ORDER — GADOBUTROL 1 MMOL/ML IV SOLN
10.0000 mL | Freq: Once | INTRAVENOUS | Status: AC | PRN
  Administered 2023-11-12: 10 mL via INTRAVENOUS

## 2023-11-18 ENCOUNTER — Encounter (HOSPITAL_COMMUNITY): Payer: Self-pay

## 2023-11-18 ENCOUNTER — Other Ambulatory Visit (HOSPITAL_COMMUNITY): Payer: Self-pay | Admitting: *Deleted

## 2023-11-18 DIAGNOSIS — R079 Chest pain, unspecified: Secondary | ICD-10-CM

## 2023-11-20 ENCOUNTER — Encounter (HOSPITAL_COMMUNITY)
Admission: RE | Admit: 2023-11-20 | Discharge: 2023-11-20 | Disposition: A | Discharge status: Home / Self Care | Source: Ambulatory Visit

## 2023-11-20 DIAGNOSIS — R079 Chest pain, unspecified: Secondary | ICD-10-CM | POA: Diagnosis not present

## 2023-11-20 MED ORDER — RUBIDIUM RB82 GENERATOR (RUBYFILL)
21.1100 | PACK | Freq: Once | INTRAVENOUS | Status: AC
  Administered 2023-11-20: 21.11 via INTRAVENOUS

## 2023-11-20 MED ORDER — REGADENOSON 0.4 MG/5ML IV SOLN
INTRAVENOUS | Status: AC
  Filled 2023-11-20: qty 5

## 2023-11-20 MED ORDER — REGADENOSON 0.4 MG/5ML IV SOLN
0.4000 mg | Freq: Once | INTRAVENOUS | Status: AC
Start: 2023-11-20 — End: 2023-11-20
  Administered 2023-11-20: 0.4 mg via INTRAVENOUS

## 2023-11-20 MED ORDER — RUBIDIUM RB82 GENERATOR (RUBYFILL)
21.0600 | PACK | Freq: Once | INTRAVENOUS | Status: AC
  Administered 2023-11-20: 21.06 via INTRAVENOUS

## 2023-11-21 LAB — NM PET CT CARDIAC PERFUSION MULTI W/ABSOLUTE BLOODFLOW
MBFR: 4.17
Nuc Rest EF: 56 %
Nuc Stress EF: 63 %
Rest MBF: 0.63 ml/g/min
Rest Nuclear Isotope Dose: 21.1 mCi
ST Depression (mm): 0 mm
Stress MBF: 2.63 ml/g/min
Stress Nuclear Isotope Dose: 21.1 mCi
TID: 0.85

## 2023-11-25 ENCOUNTER — Ambulatory Visit: Payer: Self-pay

## 2023-11-27 ENCOUNTER — Ambulatory Visit
